# Patient Record
Sex: Male | Born: 1955
Health system: Southern US, Community
[De-identification: ages and names within clinical notes are randomized; demographics above are authoritative.]

## PROBLEM LIST (undated history)

## (undated) DIAGNOSIS — F419 Anxiety disorder, unspecified: Secondary | ICD-10-CM

## (undated) DIAGNOSIS — R7303 Prediabetes: Secondary | ICD-10-CM

## (undated) DIAGNOSIS — F32A Depression, unspecified: Secondary | ICD-10-CM

## (undated) DIAGNOSIS — M1990 Unspecified osteoarthritis, unspecified site: Secondary | ICD-10-CM

## (undated) DIAGNOSIS — G4730 Sleep apnea, unspecified: Secondary | ICD-10-CM

## (undated) DIAGNOSIS — K219 Gastro-esophageal reflux disease without esophagitis: Secondary | ICD-10-CM

## (undated) HISTORY — DX: Unspecified osteoarthritis, unspecified site: M19.90

## (undated) HISTORY — PX: SPINAL FUSION: SHX223

## (undated) HISTORY — DX: Depression, unspecified: F32.A

## (undated) HISTORY — DX: Gastro-esophageal reflux disease without esophagitis: K21.9

## (undated) HISTORY — PX: KNEE SURGERY: SHX244

## (undated) HISTORY — PX: OTHER SURGICAL HISTORY: SHX169

## (undated) HISTORY — PX: AUTOGRAFT BONE SPINE: SUR117

## (undated) HISTORY — PX: TONSILLECTOMY: SUR1361

## (undated) HISTORY — PX: WISDOM TOOTH EXTRACTION: SHX21

---

## 2003-05-23 ENCOUNTER — Emergency Department (HOSPITAL_COMMUNITY): Admission: EM | Admit: 2003-05-23 | Discharge: 2003-05-23 | Payer: Self-pay | Admitting: Emergency Medicine

## 2003-05-23 ENCOUNTER — Encounter: Payer: Self-pay | Admitting: Emergency Medicine

## 2016-11-23 ENCOUNTER — Encounter (HOSPITAL_COMMUNITY): Payer: Self-pay | Admitting: *Deleted

## 2016-11-23 ENCOUNTER — Ambulatory Visit (HOSPITAL_COMMUNITY)
Admission: EM | Admit: 2016-11-23 | Discharge: 2016-11-23 | Disposition: A | Discharge status: Home / Self Care | Payer: BLUE CROSS/BLUE SHIELD | Attending: Family Medicine | Admitting: Family Medicine

## 2016-11-23 DIAGNOSIS — J111 Influenza due to unidentified influenza virus with other respiratory manifestations: Secondary | ICD-10-CM

## 2016-11-23 DIAGNOSIS — R69 Illness, unspecified: Secondary | ICD-10-CM | POA: Diagnosis not present

## 2016-11-23 MED ORDER — IPRATROPIUM BROMIDE 0.06 % NA SOLN: 2.0000 | Freq: Four times a day (QID) | NASAL | 1 refills | Status: DC

## 2016-11-23 MED ORDER — OSELTAMIVIR PHOSPHATE 75 MG PO CAPS: 75.0000 mg | ORAL_CAPSULE | Freq: Two times a day (BID) | ORAL | 0 refills | Status: DC

## 2016-11-23 MED ORDER — HYDROCOD POLST-CPM POLST ER 10-8 MG/5ML PO SUER: 5.0000 mL | Freq: Two times a day (BID) | ORAL | 0 refills | Status: DC | PRN

## 2016-11-23 NOTE — Discharge Instructions (Signed)
Drink plenty of fluids as discussed, use medicine as prescribed, and mucinex or delsym for cough. Return or see your doctor if further problems °

## 2016-11-23 NOTE — ED Provider Notes (Signed)
[facility]    CSN: [id number] Arrival date & time: 11/23/16  [place]     History   Chief Complaint Chief Complaint  Patient presents with  . Cough    HPI Brian Villanueva is a 61 y.o. male.   The history is provided by the patient and the spouse.  Cough  Cough characteristics:  Non-productive Severity:  Moderate Onset quality:  Sudden Duration:  1 day Progression:  Unchanged Chronicity:  New Smoker: no   Context: weather changes   Context: not sick contacts   Relieved by:  None tried Worsened by:  Nothing Ineffective treatments:  None tried Associated symptoms: chills, fever, myalgias, rhinorrhea and sore throat   Risk factors comment:  No flu vaccination this season.   History reviewed. No pertinent past medical history.  There are no active problems to display for this patient.   History reviewed. No pertinent surgical history.     Home Medications    Prior to Admission medications   Medication Sig Start Date End Date Taking? Authorizing Provider  chlorpheniramine-HYDROcodone (TUSSIONEX PENNKINETIC ER) 10-8 MG/5ML SUER Take 5 mLs by mouth every 12 (twelve) hours as needed for cough. 11/23/16   Billy Fischer, MD  ipratropium (ATROVENT) 0.06 % nasal spray Place 2 sprays into both nostrils 4 (four) times daily. 11/23/16   Billy Fischer, MD  oseltamivir (TAMIFLU) 75 MG capsule Take 1 capsule (75 mg total) by mouth every 12 (twelve) hours. Take all of medication. 11/23/16   Billy Fischer, MD    Family History History reviewed. No pertinent family history.  Social History Social History  Substance Use Topics  . Smoking status: Current Some Day Smoker  . Smokeless tobacco: Not on file  . Alcohol use Yes     Comment: SOCIAL     Allergies   Patient has no known allergies.   Review of Systems Review of Systems  Constitutional: Positive for chills and fever.  HENT: Positive for congestion, postnasal drip, rhinorrhea and sore throat.     Respiratory: Positive for cough.   Gastrointestinal: Negative.   Genitourinary: Negative.   Musculoskeletal: Positive for myalgias.  All other systems reviewed and are negative.    Physical Exam Triage Vital Signs ED Triage Vitals  Enc Vitals Group     BP 11/23/16 1937 134/55     Pulse Rate 11/23/16 1937 110     Resp 11/23/16 1937 18     Temp 11/23/16 1937 100.4 F (38 C)     Temp Source 11/23/16 1937 Oral     SpO2 11/23/16 1937 96 %     Weight --      Height --      Head Circumference --      Peak Flow --      Pain Score 11/23/16 1934 6     Pain Loc --      Pain Edu? --      Excl. in [place]? --    No data found.   Updated Vital Signs BP 134/55 (BP Location: Right Arm)   Pulse 110   Temp 100.4 F (38 C) (Oral)   Resp 18   SpO2 96%   Visual Acuity Right Eye Distance:   Left Eye Distance:   Bilateral Distance:    Right Eye Near:   Left Eye Near:    Bilateral Near:     Physical Exam  Constitutional: He is oriented to person, place, and time. He appears well-developed and well-nourished. No  distress.  HENT:  Right Ear: External ear normal.  Left Ear: External ear normal.  Mouth/Throat: Oropharynx is clear and moist.  Eyes: Conjunctivae are normal. Pupils are equal, round, and reactive to light.  Neck: Normal range of motion.  Cardiovascular: Normal rate, regular rhythm, normal heart sounds and intact distal pulses.   Pulmonary/Chest: Effort normal and breath sounds normal. He has no wheezes. He has no rales.  Lymphadenopathy:    He has no cervical adenopathy.  Neurological: He is alert and oriented to person, place, and time.  Nursing note and vitals reviewed.    UC Treatments / Results  Labs (all labs ordered are listed, but only abnormal results are displayed) Labs Reviewed - No data to display  EKG  EKG Interpretation None       Radiology No results found.  Procedures Procedures (including critical care time)  Medications Ordered in  UC Medications - No data to display   Initial Impression / Assessment and Plan / UC Course  I have reviewed the triage vital signs and the nursing notes.  Pertinent labs & imaging results that were available during my care of the patient were reviewed by me and considered in my medical decision making (see chart for details).       Final Clinical Impressions(s) / UC Diagnoses   Final diagnoses:  Influenza-like illness    New Prescriptions Discharge Medication List as of 11/23/2016  8:11 PM    START taking these medications   Details  chlorpheniramine-HYDROcodone (TUSSIONEX PENNKINETIC ER) 10-8 MG/5ML SUER Take 5 mLs by mouth every 12 (twelve) hours as needed for cough., Starting Fri 11/23/2016, Print    ipratropium (ATROVENT) 0.06 % nasal spray Place 2 sprays into both nostrils 4 (four) times daily., Starting Fri 11/23/2016, Print    oseltamivir (TAMIFLU) 75 MG capsule Take 1 capsule (75 mg total) by mouth every 12 (twelve) hours. Take all of medication., Starting Fri 11/23/2016, Print         Billy Fischer, MD 12/11/16 2144

## 2016-11-23 NOTE — ED Triage Notes (Signed)
Pt   Reports         Symptoms  Of  Body   Aches          Fever      Weakness   Started  Last  Pm

## 2017-04-15 ENCOUNTER — Encounter: Payer: Self-pay | Admitting: Podiatry

## 2017-04-15 ENCOUNTER — Ambulatory Visit (INDEPENDENT_AMBULATORY_CARE_PROVIDER_SITE_OTHER): Payer: BLUE CROSS/BLUE SHIELD | Admitting: Podiatry

## 2017-04-15 VITALS — BP 145/94 | HR 68

## 2017-04-15 DIAGNOSIS — L84 Corns and callosities: Secondary | ICD-10-CM | POA: Diagnosis not present

## 2017-04-15 DIAGNOSIS — M2041 Other hammer toe(s) (acquired), right foot: Secondary | ICD-10-CM

## 2017-04-15 DIAGNOSIS — M779 Enthesopathy, unspecified: Secondary | ICD-10-CM | POA: Diagnosis not present

## 2017-04-15 DIAGNOSIS — B351 Tinea unguium: Secondary | ICD-10-CM

## 2017-04-15 MED ORDER — TRIAMCINOLONE ACETONIDE 10 MG/ML IJ SUSP
10.0000 mg | Freq: Once | INTRAMUSCULAR | Status: AC
  Administered 2017-04-15: 10 mg

## 2017-04-15 NOTE — Progress Notes (Signed)
   Subjective:    Patient ID: Brian Villanueva, male    DOB: 21-Aug-1956, 61 y.o.   MRN: [id number]  HPI  Chief Complaint  Patient presents with  . Callouses    Rt foot 5th toe is painful for about 3 years       Review of Systems  All other systems reviewed and are negative.      Objective:   Physical Exam        Assessment & Plan:

## 2017-04-15 NOTE — Progress Notes (Signed)
Subjective:    Patient ID: Brian Villanueva, male   DOB: 61 y.o.   MRN: [id number]   HPI patient presents stating that his fifth digit on his right foot is been very tender and it makes it hard to wear shoes that he's tried to trim it and pad it himself    Review of Systems  All other systems reviewed and are negative.       Objective:  Physical Exam  Cardiovascular: Intact distal pulses.   Musculoskeletal: Normal range of motion.  Neurological: He is alert.  Skin: Skin is warm.  Nursing note and vitals reviewed.  neurovascular status intact muscle strength was adequate range of motion within normal limits with patient noted to have a very painful fifth digit right on the proximal phalanx proximal portion lateral side with fluid buildup underneath it that's painful when palpated. Patient's found have good digital perfusion well oriented 3     Assessment:   Inflammatory capsulitis interphalangeal joint digit 5 right with lesion formation which is most likely keratotic secondary to bone pressure but also be related to possible verruca plantaris      Plan:  H&P conditions reviewed and proximal nerve block administered. I went ahead today and I did a small injection of 2 mg Dexon some Kenalog and then went ahead debris did lesion small small amount of pinpoint bleeding and applied immune agent to create a response to virus. Applied sterile dressing reappoint to recheck

## 2017-05-06 ENCOUNTER — Ambulatory Visit (INDEPENDENT_AMBULATORY_CARE_PROVIDER_SITE_OTHER): Payer: BLUE CROSS/BLUE SHIELD | Admitting: Podiatry

## 2017-05-06 DIAGNOSIS — L84 Corns and callosities: Secondary | ICD-10-CM

## 2017-05-06 DIAGNOSIS — B079 Viral wart, unspecified: Secondary | ICD-10-CM | POA: Diagnosis not present

## 2017-05-06 DIAGNOSIS — M2041 Other hammer toe(s) (acquired), right foot: Secondary | ICD-10-CM

## 2017-05-06 NOTE — Progress Notes (Signed)
Subjective:    Patient ID: Brian Villanueva, male   DOB: 61 y.o.   MRN: [id number]   HPI patient presents stating that seems to be improved but it still sore and it had been there for a number of years    ROS      Objective:  Physical Exam neurovascular status intact negative Homans sign noted with patient's right fifth digit showing keratotic lesion that upon debridement shows pinpoint bleeding and pain to lateral pressure but stable     Assessment:    Verruca plantaris fifth digit right     Plan:    Debridement accomplished and applied medication to create an immune response and sterile dressing. Reappoint to recheck

## 2019-09-04 ENCOUNTER — Other Ambulatory Visit: Payer: Self-pay

## 2019-09-04 DIAGNOSIS — Z20822 Contact with and (suspected) exposure to covid-19: Secondary | ICD-10-CM

## 2019-09-05 LAB — NOVEL CORONAVIRUS, NAA: SARS-CoV-2, NAA: NOT DETECTED

## 2020-02-11 ENCOUNTER — Telehealth: Payer: Self-pay | Admitting: General Practice

## 2020-02-11 NOTE — Telephone Encounter (Signed)
Ok to schedule in open 30 min slot. Thanks.

## 2020-02-11 NOTE — Telephone Encounter (Signed)
Patient's wife called today Patient would like to start seeing you for primary care. Patient's father and Brother in law are both patients of yours and they recommended you   Okay to schedule?

## 2020-02-18 ENCOUNTER — Other Ambulatory Visit: Payer: Self-pay

## 2020-02-22 ENCOUNTER — Other Ambulatory Visit: Payer: Self-pay

## 2020-02-22 ENCOUNTER — Encounter: Payer: Self-pay | Admitting: Family Medicine

## 2020-02-22 ENCOUNTER — Ambulatory Visit (INDEPENDENT_AMBULATORY_CARE_PROVIDER_SITE_OTHER): Payer: Medicare Other | Admitting: Family Medicine

## 2020-02-22 VITALS — BP 120/80 | HR 70 | Temp 97.9°F | Ht 73.75 in | Wt 291.1 lb

## 2020-02-22 DIAGNOSIS — E669 Obesity, unspecified: Secondary | ICD-10-CM | POA: Insufficient documentation

## 2020-02-22 DIAGNOSIS — R12 Heartburn: Secondary | ICD-10-CM | POA: Diagnosis not present

## 2020-02-22 DIAGNOSIS — Z1211 Encounter for screening for malignant neoplasm of colon: Secondary | ICD-10-CM | POA: Diagnosis not present

## 2020-02-22 DIAGNOSIS — K219 Gastro-esophageal reflux disease without esophagitis: Secondary | ICD-10-CM | POA: Insufficient documentation

## 2020-02-22 DIAGNOSIS — E66812 Obesity, class 2: Secondary | ICD-10-CM

## 2020-02-22 HISTORY — DX: Obesity, unspecified: E66.9

## 2020-02-22 HISTORY — DX: Obesity, class 2: E66.812

## 2020-02-22 MED ORDER — ASCORBIC ACID 500 MG PO TABS: 1000.0000 mg | ORAL_TABLET | Freq: Every day | ORAL | Status: AC

## 2020-02-22 MED ORDER — OMEPRAZOLE 20 MG PO CPDR: 20.0000 mg | DELAYED_RELEASE_CAPSULE | Freq: Every day | ORAL | Status: DC | PRN

## 2020-02-22 NOTE — Assessment & Plan Note (Signed)
Discussed omeprazole use as well as pros/cons of medication. Given no significant reflux symptoms endorsed, suggested trial taper down on omeprazole use.

## 2020-02-22 NOTE — Assessment & Plan Note (Signed)
Encouraged healthy diet and lifestyle to affect sustainable weight loss. Reviewed beneficial effect of weight loss on joints.

## 2020-02-22 NOTE — Patient Instructions (Addendum)
Try taper off omeprazole - find least amount of medicine to do the job.  Return at your convenience for wellness visit/annual physical.  We will refer you to [facility] for colonoscopy  Good to meet you today. Call us with questions.

## 2020-02-22 NOTE — Progress Notes (Signed)
This visit was conducted in person.  BP 120/80 (BP Location: Left Arm, Patient Position: Sitting, Cuff Size: Large)   Pulse 70   Temp 97.9 F (36.6 C) (Temporal)   Ht 6' 1.75" (1.873 m)   Wt 291 lb 1 oz (132 kg)   SpO2 95%   BMI 37.62 kg/m    CC: new pt to establish care Subjective:    Patient ID: Brian Villanueva, male    DOB: 12/17/1955, 64 y.o.   MRN: [id number]  HPI: Brian Villanueva is a 64 y.o. male presenting on 02/22/2020 for New Patient (Initial Visit)   I see patient's father Mally) and BIL Leta Baptist). Here to establish care today. No prior PCP. Received medicare last year (>1 yr ago).   Would like to return for AMW/CPE.  Colon cancer screening - discussed options, requests colonoscopy Sees dentist and eye doctor regularly.   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 Interim medical history since our last visit reviewed. Allergies and medications reviewed and updated. Outpatient Medications Prior to Visit  Medication Sig Dispense Refill  . Cholecalciferol (VITAMIN D3) 50 MCG (2000 UT) TABS Take 1 tablet by mouth daily.    Marland Kitchen GLUCOSAMINE-CHONDROITIN PO Take 2 tablets by mouth daily. 1200 mg    . Multiple Vitamins-Minerals (CENTRUM SILVER 50+MEN PO) Take by mouth daily.    . Zinc 22.5 MG TABS Take 1 tablet by mouth daily.    . Ascorbic Acid (VITAMIN C) 1000 MG tablet Take 1,000 mg by mouth daily. Takes 2 tablets    . OMEPRAZOLE PO Take 20 mg by mouth daily. OTC    . chlorpheniramine-HYDROcodone (TUSSIONEX PENNKINETIC ER) 10-8 MG/5ML SUER Take 5 mLs by mouth every 12 (twelve) hours as needed for cough. (Patient not taking: Reported on 04/15/2017) 140 mL 0  . ipratropium (ATROVENT) 0.06 % nasal  spray Place 2 sprays into both nostrils 4 (four) times daily. (Patient not taking: Reported on 04/15/2017) 15 mL 1  . oseltamivir (TAMIFLU) 75 MG capsule Take 1 capsule (75 mg total) by mouth every 12 (twelve) hours. Take all of medication. (Patient not taking: Reported on 04/15/2017) 10 capsule 0   No facility-administered medications prior to visit.     Per HPI unless specifically indicated in ROS section below Review of Systems Objective:    BP 120/80 (BP Location: Left Arm, Patient Position: Sitting, Cuff Size: Large)   Pulse 70   Temp 97.9 F (36.6 C) (Temporal)   Ht 6' 1.75" (1.873 m)   Wt 291 lb 1 oz (132 kg)   SpO2 95%   BMI 37.62 kg/m   Wt Readings from Last 3 Encounters:  02/22/20 291 lb 1 oz (132 kg)    Physical Exam Vitals and nursing note reviewed.  Constitutional:      Appearance: Normal appearance.  Eyes:     Extraocular Movements: Extraocular movements intact.     Conjunctiva/sclera: Conjunctivae normal.     Comments:  L pupil slightly larger than right, chronic Pupils equally reactive to light  Cardiovascular:     Rate and Rhythm: Normal rate and regular rhythm.     Pulses: Normal pulses.     Heart sounds: Normal heart sounds. No murmur.  Pulmonary:  Effort: Pulmonary effort is normal. No respiratory distress.     Breath sounds: Normal breath sounds. No wheezing, rhonchi or rales.  Musculoskeletal:     Right lower leg: No edema.     Left lower leg: No edema.  Skin:    Findings: No rash.  Neurological:     Mental Status: He is alert.  Psychiatric:        Mood and Affect: Mood normal.        Behavior: Behavior normal.       Results for orders placed or performed in visit on 09/04/19  Novel Coronavirus, NAA ([facility])   Specimen: Nasopharyngeal(NP) swabs in vial transport medium   NASOPHARYNGE  TESTING  Result Value Ref Range   SARS-CoV-2, NAA Not Detected Not Detected   Assessment & Plan:  This visit occurred during the SARS-CoV-2 public health  emergency.  Safety protocols were in place, including screening questions prior to the visit, additional usage of staff PPE, and extensive cleaning of exam room while observing appropriate contact time as indicated for disinfecting solutions.   Problem List Items Addressed This Visit    Obesity, Class II, BMI 35-39.9, no comorbidity    Encouraged healthy diet and lifestyle to affect sustainable weight loss. Reviewed beneficial effect of weight loss on joints.      Heartburn    Discussed omeprazole use as well as pros/cons of medication. Given no significant reflux symptoms endorsed, suggested trial taper down on omeprazole use.        Other Visit Diagnoses    Special screening for malignant neoplasms, colon    -  Primary   Relevant Orders   Ambulatory referral to Gastroenterology       Meds ordered this encounter  Medications  . vitamin C (VITAMIN C) 500 MG tablet    Sig: Take 2 tablets (1,000 mg total) by mouth daily.  Marland Kitchen omeprazole (PRILOSEC) 20 MG capsule    Sig: Take 1 capsule (20 mg total) by mouth daily as needed (heartburn).   Orders Placed This Encounter  Procedures  . Ambulatory referral to Gastroenterology    Referral Priority:   Routine    Referral Type:   Consultation    Referral Reason:   Specialty Services Required    Number of Visits Requested:   1   Patient Instructions  Try taper off omeprazole - find least amount of medicine to do the job.  Return at your convenience for wellness visit/annual physical.  We will refer you to [facility] for colonoscopy  Good to meet you today. Call us with questions.    Follow up plan: No follow-ups on file.  Ria Bush, MD

## 2020-02-23 ENCOUNTER — Encounter: Payer: Self-pay | Admitting: Gastroenterology

## 2020-03-03 ENCOUNTER — Ambulatory Visit (AMBULATORY_SURGERY_CENTER): Payer: Self-pay | Admitting: *Deleted

## 2020-03-03 ENCOUNTER — Other Ambulatory Visit: Payer: Self-pay

## 2020-03-03 ENCOUNTER — Ambulatory Visit (INDEPENDENT_AMBULATORY_CARE_PROVIDER_SITE_OTHER): Payer: Medicare Other

## 2020-03-03 VITALS — Temp 96.6°F | Ht 73.75 in | Wt 291.0 lb

## 2020-03-03 VITALS — BP 120/80 | HR 70 | Ht 73.75 in | Wt 291.0 lb

## 2020-03-03 DIAGNOSIS — Z0000 Encounter for general adult medical examination without abnormal findings: Secondary | ICD-10-CM | POA: Diagnosis not present

## 2020-03-03 DIAGNOSIS — Z1211 Encounter for screening for malignant neoplasm of colon: Secondary | ICD-10-CM

## 2020-03-03 MED ORDER — SUTAB 1479-225-188 MG PO TABS: 24.0000 | ORAL_TABLET | ORAL | 0 refills | Status: DC

## 2020-03-03 NOTE — Progress Notes (Signed)
PCP notes:  Health Maintenance: Prevnar 18- due Tdap- decline Colonoscopy- scheduled for 03/17/2020 @ 9:30 am    Abnormal Screenings: none   Patient concerns: none   Nurse concerns: none   Next PCP appt.: 03/10/2020 @ 10:30 am

## 2020-03-03 NOTE — Progress Notes (Signed)
Subjective:   Brian Villanueva is a 64 y.o. male who presents for an Initial Medicare Annual Wellness Visit.  Review of Systems: N/A    This visit is being conducted through telemedicine via telephone at the nurse health advisor's home address due to the COVID-19 pandemic. This patient has given me verbal consent via doximity to conduct this visit, patient states they are participating from their home address. Patient and myself are on the telephone call. There is no referral for this visit. Some vital signs may be absent or patient reported.    Patient identification: identified by name, DOB, and current address   Cardiac Risk Factors include: advanced age (>33men, >89 women);male gender    Objective:    Today's Vitals   03/03/20 0941  BP: 120/80  Pulse: 70  Weight: 291 lb (132 kg)  Height: 6' 1.75" (1.873 m)   Body mass index is 37.62 kg/m.  Advanced Directives 03/03/2020  Does Patient Have a Medical Advance Directive? No  Would patient like information on creating a medical advance directive? No - Patient declined    Current Medications (verified) Outpatient Encounter Medications as of 03/03/2020  Medication Sig  . Cholecalciferol (VITAMIN D3) 50 MCG (2000 UT) TABS Take 1 tablet by mouth daily.  Marland Kitchen GLUCOSAMINE-CHONDROITIN PO Take 2 tablets by mouth daily. 1200 mg  . Multiple Vitamins-Minerals (CENTRUM SILVER 50+MEN PO) Take by mouth daily.  Marland Kitchen omeprazole (PRILOSEC) 20 MG capsule Take 1 capsule (20 mg total) by mouth daily as needed (heartburn).  . vitamin C (VITAMIN C) 500 MG tablet Take 2 tablets (1,000 mg total) by mouth daily.  . Zinc 22.5 MG TABS Take 1 tablet by mouth daily.   No facility-administered encounter medications on file as of 03/03/2020.    Allergies (verified) Patient has no known allergies.   History: History reviewed. No pertinent past medical history. Past Surgical History:  Procedure Laterality Date  . KNEE SURGERY Left    x2 growing up -  ligament tears   Family History  Problem Relation Age of Onset  . Cancer Mother        throat  . High Cholesterol Father   . High blood pressure Father   . Atrial fibrillation Father   . Arthritis Maternal Grandfather   . Cancer Paternal Grandmother    Social History   Socioeconomic History  . Marital status: Married    Spouse name: Not on file  . Number of children: Not on file  . Years of education: Not on file  . Highest education level: Not on file  Occupational History  . Not on file  Tobacco Use  . Smoking status: Light Tobacco Smoker    Types: Cigars  . Smokeless tobacco: Never Used  Substance and Sexual Activity  . Alcohol use: Yes    Comment: social - a few a month  . Drug use: Never  . Sexual activity: Yes    Partners: Female  Other Topics Concern  . 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 Difficulty of Paying Living Expenses: Not hard at all  Food Insecurity: No Food Insecurity  . Worried About Charity fundraiser in the Last  Year: Never true  . Ran Out of Food in the Last Year: Never true  Transportation Needs: No Transportation Needs  . Lack of Transportation (Medical): No  . Lack of Transportation (Non-Medical): No  Physical Activity: Inactive  . Days of Exercise per Week: 0 days  . Minutes of Exercise per Session: 0 min  Stress: No Stress Concern Present  . Feeling of Stress : Not at all  Social Connections:   . Frequency of Communication with Friends and Family:   . Frequency of Social Gatherings with Friends and Family:   . Attends Religious Services:   . Active Member of Clubs or Organizations:   . Attends Archivist Meetings:   Marland Kitchen Marital Status:    Tobacco  Counseling Ready to quit: Not Answered Counseling given: Not Answered   Clinical Intake:  Pre-visit preparation completed: Yes  Pain : No/denies pain     Nutritional Risks: None Diabetes: No  How often do you need to have someone help you when you read instructions, pamphlets, or other written materials from your doctor or pharmacy?: 1 - Never What is the last grade level you completed in school?: some college  Interpreter Needed?: No  Information entered by :: CJohnson, LPN  Activities of Daily Living In your present state of health, do you have any difficulty performing the following activities: 03/03/2020  Hearing? N  Vision? N  Difficulty concentrating or making decisions? N  Walking or climbing stairs? N  Dressing or bathing? N  Doing errands, shopping? N  Preparing Food and eating ? N  Using the Toilet? N  In the past six months, have you accidently leaked urine? N  Do you have problems with loss of bowel control? N  Managing your Medications? N  Managing your Finances? N  Housekeeping or managing your Housekeeping? N  Some recent data might be hidden     Immunizations and Health Maintenance Immunization History  Administered Date(s) Administered  . Influenza-Unspecified 07/31/2019  . PFIZER SARS-COV-2 Vaccination 12/25/2019, 01/19/2020   Health Maintenance Due  Topic Date Due  . Hepatitis C Screening  Never done  . DTaP/Tdap/Td (1 - Tdap) Never done  . COLONOSCOPY  Never done  . PNA vac Low Risk Adult (1 of 2 - PCV13) Never done    Patient Care Team: Ria Bush, MD as PCP - General (Family Medicine)  Indicate any recent Medical Services you may have received from other than [facility] providers in the past year (date may be approximate).    Assessment:   This is a routine wellness examination for Brian Villanueva.  Hearing/Vision screen  Hearing Screening   125Hz  250Hz  500Hz  1000Hz  2000Hz  3000Hz  4000Hz  6000Hz  8000Hz   Right ear:           Left ear:            Vision Screening Comments: Patient gets annual eye exams  Dietary issues and exercise activities discussed: Current Exercise Habits: The patient has a physically strenuous job, but has no regular exercise apart from work., Exercise limited by: None identified  Goals    . Patient Stated     03/03/2020, I will maintain and continue medications as prescribed.       Depression Screen PHQ 2/9 Scores 03/03/2020 02/22/2020  PHQ - 2 Score 0 0  PHQ- 9 Score 0 2    Fall Risk Fall Risk  03/03/2020  Falls in the past year? 0  Number falls in past yr: 0  Injury with Fall? 0  Risk for fall due to : No Fall Risks  Follow up Falls evaluation completed;Falls prevention discussed    Is the patient's home free of loose throw rugs in walkways, pet beds, electrical cords, etc?   yes      Grab bars in the bathroom? no      Handrails on the stairs?   yes      Adequate lighting?   yes  Timed Get Up and Go performed: N/A  Cognitive Function: MMSE - Mini Mental State Exam 03/03/2020  Orientation to time 5  Orientation to Place 5  Registration 3  Attention/ Calculation 5  Recall 3  Language- repeat 1       Mini Cog  Mini-Cog screen was completed. Maximum score is 22. A value of 0 denotes this part of the MMSE was not completed or the patient failed this part of the Mini-Cog screening.  Screening Tests Health Maintenance  Topic Date Due  . Hepatitis C Screening  Never done  . DTaP/Tdap/Td (1 - Tdap) Never done  . COLONOSCOPY  Never done  . PNA vac Low Risk Adult (1 of 2 - PCV13) Never done  . DTAP VACCINES (1) 02/11/2024 (Originally 02/24/1956)  . TETANUS/TDAP  02/11/2024 (Originally 12/27/1972)  . INFLUENZA VACCINE  06/12/2020  . COVID-19 Vaccine  Completed    Qualifies for Shingles Vaccine: Yes  Cancer Screenings: Lung: Low Dose CT Chest recommended if Age 61-80 years, 30 pack-year currently smoking OR have quit w/in 15 years. Patient does not qualify. Colorectal: scheduled for  03/17/2020 at 9:30 am per patient   Additional Screenings:  Hepatitis C Screening: due      Plan:   Patient will maintain and continue medications as prescribed.   I have personally reviewed and noted the following in the patient's chart:   . Medical and social history . Use of alcohol, tobacco or illicit drugs  . Current medications and supplements . Functional ability and status . Nutritional status . Physical activity . Advanced directives . List of other physicians . Hospitalizations, surgeries, and ER visits in previous 12 months . Vitals . Screenings to include cognitive, depression, and falls . Referrals and appointments  In addition, I have reviewed and discussed with patient certain preventive protocols, quality metrics, and best practice recommendations. A written personalized care plan for preventive services as well as general preventive health recommendations were provided to patient.     Andrez Grime, LPN   [phone]

## 2020-03-03 NOTE — Patient Instructions (Signed)
[name] , Thank you for taking time to come for your Medicare Wellness Visit. I appreciate your ongoing commitment to your health goals. Please review the following plan we discussed and let me know if I can assist you in the future.   Screening recommendations/referrals: Colonoscopy: scheduled 03/17/2020 @ 9:30 am Recommended yearly ophthalmology/optometry visit for glaucoma screening and checkup Recommended yearly dental visit for hygiene and checkup  Vaccinations: Influenza vaccine: Up to date, completed 07/31/2019 Pneumococcal vaccine: due Tdap vaccine: decline Shingles vaccine: discussed    Advanced directives: Advance directive discussed with you today. Even though you declined this today please call our office should you change your mind and we can give you the proper paperwork for you to fill out.  Conditions/risks identified: none  Next appointment: 03/10/2020 @ 10:30 am   Preventive Care 65 Years and Older, Male Preventive care refers to lifestyle choices and visits with your health care provider that can promote health and wellness. What does preventive care include?  A yearly physical exam. This is also called an annual well check.  Dental exams once or twice a year.  Routine eye exams. Ask your health care provider how often you should have your eyes checked.  Personal lifestyle choices, including:  Daily care of your teeth and gums.  Regular physical activity.  Eating a healthy diet.  Avoiding tobacco and drug use.  Limiting alcohol use.  Practicing safe sex.  Taking low doses of aspirin every day.  Taking vitamin and mineral supplements as recommended by your health care provider. What happens during an annual well check? The services and screenings done by your health care provider during your annual well check will depend on your age, overall health, lifestyle risk factors, and family history of disease. Counseling  Your health care provider may ask  you questions about your:  Alcohol use.  Tobacco use.  Drug use.  Emotional well-being.  Home and relationship well-being.  Sexual activity.  Eating habits.  History of falls.  Memory and ability to understand (cognition).  Work and work Statistician. Screening  You may have the following tests or measurements:  Height, weight, and BMI.  Blood pressure.  Lipid and cholesterol levels. These may be checked every 5 years, or more frequently if you are over 53 years old.  Skin check.  Lung cancer screening. You may have this screening every year starting at age 37 if you have a 30-pack-year history of smoking and currently smoke or have quit within the past 15 years.  Fecal occult blood test (FOBT) of the stool. You may have this test every year starting at age 16.  Flexible sigmoidoscopy or colonoscopy. You may have a sigmoidoscopy every 5 years or a colonoscopy every 10 years starting at age 47.  Prostate cancer screening. Recommendations will vary depending on your family history and other risks.  Hepatitis C blood test.  Hepatitis B blood test.  Sexually transmitted disease (STD) testing.  Diabetes screening. This is done by checking your blood sugar (glucose) after you have not eaten for a while (fasting). You may have this done every 1-3 years.  Abdominal aortic aneurysm (AAA) screening. You may need this if you are a current or former smoker.  Osteoporosis. You may be screened starting at age 48 if you are at high risk. Talk with your health care provider about your test results, treatment options, and if necessary, the need for more tests. Vaccines  Your health care provider may recommend certain vaccines, such  as:  Influenza vaccine. This is recommended every year.  Tetanus, diphtheria, and acellular pertussis (Tdap, Td) vaccine. You may need a Td booster every 10 years.  Zoster vaccine. You may need this after age 27.  Pneumococcal 13-valent conjugate  (PCV13) vaccine. One dose is recommended after age 19.  Pneumococcal polysaccharide (PPSV23) vaccine. One dose is recommended after age 36. Talk to your health care provider about which screenings and vaccines you need and how often you need them. This information is not intended to replace advice given to you by your health care provider. Make sure you discuss any questions you have with your health care provider. Document Released: 11/25/2015 Document Revised: 07/18/2016 Document Reviewed: 08/30/2015 Elsevier Interactive Patient Education  2017 [employer] Prevention in the Home Falls can cause injuries. They can happen to people of all ages. There are many things you can do to make your home safe and to help prevent falls. What can I do on the outside of my home?  Regularly fix the edges of walkways and driveways and fix any cracks.  Remove anything that might make you trip as you walk through a door, such as a raised step or threshold.  Trim any bushes or trees on the path to your home.  Use bright outdoor lighting.  Clear any walking paths of anything that might make someone trip, such as rocks or tools.  Regularly check to see if handrails are loose or broken. Make sure that both sides of any steps have handrails.  Any raised decks and porches should have guardrails on the edges.  Have any leaves, snow, or ice cleared regularly.  Use sand or salt on walking paths during winter.  Clean up any spills in your garage right away. This includes oil or grease spills. What can I do in the bathroom?  Use night lights.  Install grab bars by the toilet and in the tub and shower. Do not use towel bars as grab bars.  Use non-skid mats or decals in the tub or shower.  If you need to sit down in the shower, use a plastic, non-slip stool.  Keep the floor dry. Clean up any water that spills on the floor as soon as it happens.  Remove soap buildup in the tub or shower  regularly.  Attach bath mats securely with double-sided non-slip rug tape.  Do not have throw rugs and other things on the floor that can make you trip. What can I do in the bedroom?  Use night lights.  Make sure that you have a light by your bed that is easy to reach.  Do not use any sheets or blankets that are too big for your bed. They should not hang down onto the floor.  Have a firm chair that has side arms. You can use this for support while you get dressed.  Do not have throw rugs and other things on the floor that can make you trip. What can I do in the kitchen?  Clean up any spills right away.  Avoid walking on wet floors.  Keep items that you use a lot in easy-to-reach places.  If you need to reach something above you, use a strong step stool that has a grab bar.  Keep electrical cords out of the way.  Do not use floor polish or wax that makes floors slippery. If you must use wax, use non-skid floor wax.  Do not have throw rugs and other things on the  floor that can make you trip. What can I do with my stairs?  Do not leave any items on the stairs.  Make sure that there are handrails on both sides of the stairs and use them. Fix handrails that are broken or loose. Make sure that handrails are as long as the stairways.  Check any carpeting to make sure that it is firmly attached to the stairs. Fix any carpet that is loose or worn.  Avoid having throw rugs at the top or bottom of the stairs. If you do have throw rugs, attach them to the floor with carpet tape.  Make sure that you have a light switch at the top of the stairs and the bottom of the stairs. If you do not have them, ask someone to add them for you. What else can I do to help prevent falls?  Wear shoes that:  Do not have high heels.  Have rubber bottoms.  Are comfortable and fit you well.  Are closed at the toe. Do not wear sandals.  If you use a stepladder:  Make sure that it is fully  opened. Do not climb a closed stepladder.  Make sure that both sides of the stepladder are locked into place.  Ask someone to hold it for you, if possible.  Clearly mark and make sure that you can see:  Any grab bars or handrails.  First and last steps.  Where the edge of each step is.  Use tools that help you move around (mobility aids) if they are needed. These include:  Canes.  Walkers.  Scooters.  Crutches.  Turn on the lights when you go into a dark area. Replace any light bulbs as soon as they burn out.  Set up your furniture so you have a clear path. Avoid moving your furniture around.  If any of your floors are uneven, fix them.  If there are any pets around you, be aware of where they are.  Review your medicines with your doctor. Some medicines can make you feel dizzy. This can increase your chance of falling. Ask your doctor what other things that you can do to help prevent falls. This information is not intended to replace advice given to you by your health care provider. Make sure you discuss any questions you have with your health care provider. Document Released: 08/25/2009 Document Revised: 04/05/2016 Document Reviewed: 12/03/2014 Elsevier Interactive Patient Education  2017 [employer].

## 2020-03-03 NOTE — Progress Notes (Signed)
No egg or soy allergy known to patient  No issues with past sedation with any surgeries  or procedures, no intubation problems  No diet pills per patient No home 02 use per patient  No blood thinners per patient  Pt denies issues with constipation  No A fib or A flutter  EMMI video sent to pt's e mail   01-19-2020 comp covid vaccines   Due to the COVID-19 pandemic we are asking patients to follow these guidelines. Please only bring one care partner. Please be aware that your care partner may wait in the car in the parking lot or if they feel like they will be too hot to wait in the car, they may wait in the lobby on the 4th floor. All care partners are required to wear a mask the entire time (we do not have any that we can provide them), they need to practice social distancing, and we will do a Covid check for all patient's and care partners when you arrive. Also we will check their temperature and your temperature. If the care partner waits in their car they need to stay in the parking lot the entire time and we will call them on their cell phone when the patient is ready for discharge so they can bring the car to the front of the building. Also all patient's will need to wear a mask into building.  Sutab univ coupon to pt and to pharmacy

## 2020-03-07 ENCOUNTER — Telehealth: Payer: Self-pay | Admitting: Gastroenterology

## 2020-03-07 DIAGNOSIS — Z1211 Encounter for screening for malignant neoplasm of colon: Secondary | ICD-10-CM

## 2020-03-07 MED ORDER — SUTAB 1479-225-188 MG PO TABS: 24.0000 | ORAL_TABLET | ORAL | 0 refills | Status: DC

## 2020-03-10 ENCOUNTER — Encounter: Payer: Self-pay | Admitting: Family Medicine

## 2020-03-10 ENCOUNTER — Other Ambulatory Visit: Payer: Self-pay

## 2020-03-10 ENCOUNTER — Ambulatory Visit (INDEPENDENT_AMBULATORY_CARE_PROVIDER_SITE_OTHER): Payer: Medicare Other | Admitting: Family Medicine

## 2020-03-10 VITALS — BP 140/82 | HR 68 | Temp 97.9°F | Ht 73.75 in | Wt 295.1 lb

## 2020-03-10 DIAGNOSIS — Z1211 Encounter for screening for malignant neoplasm of colon: Secondary | ICD-10-CM

## 2020-03-10 DIAGNOSIS — D492 Neoplasm of unspecified behavior of bone, soft tissue, and skin: Secondary | ICD-10-CM | POA: Diagnosis not present

## 2020-03-10 DIAGNOSIS — Z0000 Encounter for general adult medical examination without abnormal findings: Secondary | ICD-10-CM | POA: Insufficient documentation

## 2020-03-10 DIAGNOSIS — Z131 Encounter for screening for diabetes mellitus: Secondary | ICD-10-CM

## 2020-03-10 DIAGNOSIS — Z7189 Other specified counseling: Secondary | ICD-10-CM | POA: Diagnosis not present

## 2020-03-10 DIAGNOSIS — Z23 Encounter for immunization: Secondary | ICD-10-CM | POA: Diagnosis not present

## 2020-03-10 DIAGNOSIS — E669 Obesity, unspecified: Secondary | ICD-10-CM | POA: Diagnosis not present

## 2020-03-10 DIAGNOSIS — R12 Heartburn: Secondary | ICD-10-CM

## 2020-03-10 DIAGNOSIS — Z0001 Encounter for general adult medical examination with abnormal findings: Secondary | ICD-10-CM | POA: Insufficient documentation

## 2020-03-10 DIAGNOSIS — Z13220 Encounter for screening for lipoid disorders: Secondary | ICD-10-CM | POA: Diagnosis not present

## 2020-03-10 LAB — HEPATIC FUNCTION PANEL
ALT: 21 U/L (ref 0–53)
AST: 19 U/L (ref 0–37)
Albumin: 4.5 g/dL (ref 3.5–5.2)
Alkaline Phosphatase: 60 U/L (ref 39–117)
Bilirubin, Direct: 0.1 mg/dL (ref 0.0–0.3)
Total Bilirubin: 0.4 mg/dL (ref 0.2–1.2)
Total Protein: 7.2 g/dL (ref 6.0–8.3)

## 2020-03-10 LAB — BASIC METABOLIC PANEL
BUN: 21 mg/dL (ref 6–23)
CO2: 33 mEq/L — ABNORMAL HIGH (ref 19–32)
Calcium: 9.1 mg/dL (ref 8.4–10.5)
Chloride: 102 mEq/L (ref 96–112)
Creatinine, Ser: 0.86 mg/dL (ref 0.40–1.50)
GFR: 88.92 mL/min (ref >60.00)
Glucose, Bld: 93 mg/dL (ref 70–99)
Potassium: 4.6 mEq/L (ref 3.5–5.1)
Sodium: 140 mEq/L (ref 135–145)

## 2020-03-10 LAB — TSH: TSH: 0.71 u[IU]/mL (ref 0.35–4.50)

## 2020-03-10 LAB — LIPID PANEL
Cholesterol: 176 mg/dL (ref 0–200)
HDL: 37.8 mg/dL — ABNORMAL LOW (ref >39.00)
LDL Cholesterol: 108 mg/dL — ABNORMAL HIGH (ref 0–99)
NonHDL: 138.03
Total CHOL/HDL Ratio: 5
Triglycerides: 151 mg/dL — ABNORMAL HIGH (ref 0.0–149.0)
VLDL: 30.2 mg/dL (ref 0.0–40.0)

## 2020-03-10 LAB — PSA, MEDICARE: PSA: 0.18 ng/ml (ref 0.10–4.00)

## 2020-03-10 NOTE — Assessment & Plan Note (Signed)
Advanced directive discussion - does not have. Packet provided. Would want wife or son to be HCPOA.

## 2020-03-10 NOTE — Progress Notes (Signed)
This visit was conducted in person.  BP 140/82 (BP Location: Left Arm, Patient Position: Sitting, Cuff Size: Large)   Pulse 68   Temp 97.9 F (36.6 C) (Temporal)   Ht 6' 1.75" (1.873 m)   Wt 295 lb 1 oz (133.8 kg)   SpO2 95%   BMI 38.14 kg/m    CC: new pt to establish care Subjective:    Patient ID: Brian Villanueva, male    DOB: 07/29/1956, 64 y.o.   MRN: [id number]  HPI: Brian Villanueva is a 64 y.o. male presenting on 03/10/2020 for Annual Exam (Prt 2. )   Saw health advisor last week for medicare wellness visit. Note reviewed.    No exam data present    Clinical Support from 03/03/2020 in [place] at [facility] Total Score  0      Fall Risk  03/03/2020  Falls in the past year? 0  Number falls in past yr: 0  Injury with Fall? 0  Risk for fall due to : No Fall Risks  Follow up Falls evaluation completed;Falls prevention discussed   Labs today.   Preventative: Colon cancer screening - colonoscopy scheduled next month Prostate cancer screening - asxs. Agrees to PSA and DRE today then likely space out.  Lung cancer screening - not eligible Flu shot - yearly Tetanus - unsure Pneumovax - today  COVID vaccine - pfizer completed 01/2020. Shingrix - discussed. To check with pharmacy.  Advanced directive discussion - does not have. Packet provided. Would want wife or son to be HCPOA.  Seat belt use discussed Sunscreen use discussed. No changing moles on skin.  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 Interim medical history since  our last visit reviewed. Allergies and medications reviewed and updated. Outpatient Medications Prior to Visit  Medication Sig Dispense Refill  . Cholecalciferol (VITAMIN D3) 50 MCG (2000 UT) TABS Take 1 tablet by mouth daily.    Marland Kitchen GLUCOSAMINE-CHONDROITIN PO Take 2 tablets by mouth daily. 1200 mg    . Multiple Vitamins-Minerals (CENTRUM SILVER 50+MEN PO) Take by mouth daily.    Marland Kitchen omeprazole (PRILOSEC) 20 MG capsule Take 1 capsule (20 mg total) by mouth daily as needed (heartburn).    . Sodium Sulfate-Mag Sulfate-KCl (SUTAB) [phone] MG TABS Take 24 tablets by mouth as directed. 24 tablet 0  . Sodium Sulfate-Mag Sulfate-KCl (SUTAB) [phone] MG TABS Take 24 tablets by mouth as directed. 24 tablet 0  . vitamin C (VITAMIN C) 500 MG tablet Take 2 tablets (1,000 mg total) by mouth daily.    . Zinc 22.5 MG TABS Take 1 tablet by mouth daily.     No facility-administered medications prior to visit.     Per HPI unless specifically indicated in ROS section below Review of Systems  Constitutional: Negative for activity change, appetite change, chills, fatigue, fever and unexpected weight change.  HENT: Positive for congestion (allergies). Negative for hearing loss.   Eyes: Negative for visual disturbance.  Respiratory: Negative for cough, chest tightness, shortness of breath and wheezing.   Cardiovascular: Negative for  chest pain, palpitations and leg swelling.  Gastrointestinal: Negative for abdominal distention, abdominal pain, blood in stool, constipation, diarrhea, nausea and vomiting.  Genitourinary: Negative for difficulty urinating and hematuria.  Musculoskeletal: Negative for arthralgias, myalgias and neck pain.  Skin: Negative for rash.  Neurological: Negative for dizziness, seizures, syncope and headaches.  Hematological: Negative for adenopathy. Does not bruise/bleed easily.  Psychiatric/Behavioral: Negative for dysphoric mood. The patient is not nervous/anxious.    Objective:    BP 140/82 (BP Location: Left Arm, Patient Position: Sitting, Cuff Size: Large)   Pulse 68   Temp 97.9 F (36.6 C) (Temporal)   Ht 6' 1.75" (1.873 m)   Wt 295 lb 1 oz (133.8 kg)   SpO2 95%   BMI 38.14 kg/m   Wt Readings from Last 3 Encounters:  03/10/20 295 lb 1 oz (133.8 kg)  03/03/20 291 lb (132 kg)  03/03/20 291 lb (132 kg)      Physical Exam Vitals and nursing note reviewed.  Constitutional:      General: He is not in acute distress.    Appearance: Normal appearance. He is well-developed. He is obese. He is not ill-appearing.  HENT:     Head:     Comments: Stuck on growth with mild hyperpigmentation to left cheek    Right Ear: Hearing, tympanic membrane, ear canal and external ear normal.     Left Ear: Hearing, tympanic membrane, ear canal and external ear normal.  Eyes:     General: No scleral icterus.    Extraocular Movements: Extraocular movements intact.     Conjunctiva/sclera: Conjunctivae normal.     Pupils: Pupils are equal, round, and reactive to light.  Cardiovascular:     Rate and Rhythm: Normal rate and regular rhythm.     Pulses: Normal pulses.          Radial pulses are 2+ on the right side and 2+ on the left side.     Heart sounds: Normal heart sounds. No murmur.  Pulmonary:     Effort: Pulmonary effort is normal. No respiratory distress.     Breath sounds: Normal breath sounds. No wheezing, rhonchi or rales.  Abdominal:     General: Abdomen is flat. Bowel sounds are normal. There is no distension.     Palpations: Abdomen is soft. There is no mass.     Tenderness: There is no abdominal tenderness. There is no guarding or rebound.     Hernia: No hernia is present.  Genitourinary:    Rectum: Normal. No mass, tenderness, anal fissure, external hemorrhoid or internal hemorrhoid. Normal anal tone.     Comments: Difficult to palpate prostate due to body habitus Musculoskeletal:        General: Normal range of motion.     Cervical back: Normal range of  motion and neck supple.     Right lower leg: No edema.     Left lower leg: No edema.  Lymphadenopathy:     Cervical: No cervical adenopathy.  Skin:    General: Skin is warm and dry.     Findings: No rash.  Neurological:     General: No focal deficit present.     Mental Status: He is alert and oriented to person, place, and time.     Comments: CN grossly intact, station and gait intact  Psychiatric:        Mood and Affect: Mood normal.        Behavior: Behavior normal.  Thought Content: Thought content normal.        Judgment: Judgment normal.       Results for orders placed or performed in visit on 09/04/19  Novel Coronavirus, NAA ([facility])   Specimen: Nasopharyngeal(NP) swabs in vial transport medium   NASOPHARYNGE  TESTING  Result Value Ref Range   SARS-CoV-2, NAA Not Detected Not Detected   Assessment & Plan:  This visit occurred during the SARS-CoV-2 public health emergency.  Safety protocols were in place, including screening questions prior to the visit, additional usage of staff PPE, and extensive cleaning of exam room while observing appropriate contact time as indicated for disinfecting solutions.   Problem List Items Addressed This Visit    Skin growth    Anticipate SK. Refer to derm.       Relevant Orders   Ambulatory referral to Dermatology   Obesity, Class II, BMI 35-39.9, no comorbidity    Encouraged healthy diet and lifestyle changes to affect sustainable weight loss.       Relevant Orders   TSH   Hepatic function panel   Heartburn    Managing with PRN PPI OTC      Health maintenance examination - Primary    Preventative protocols reviewed and updated unless pt declined. Discussed healthy diet and lifestyle.       Advanced directives, counseling/discussion    Advanced directive discussion - does not have. Packet provided. Would want wife or son to be HCPOA.        Other Visit Diagnoses    Special screening for malignant neoplasms, colon        Relevant Orders   PSA, Medicare   Diabetes mellitus screening       Relevant Orders   Basic metabolic panel   Lipid screening       Relevant Orders   Lipid panel   Need for 23-polyvalent pneumococcal polysaccharide vaccine       Relevant Orders   Pneumococcal polysaccharide vaccine 23-valent greater than or equal to 2yo subcutaneous/IM (Completed)       No orders of the defined types were placed in this encounter.  Orders Placed This Encounter  Procedures  . Pneumococcal polysaccharide vaccine 23-valent greater than or equal to 2yo subcutaneous/IM  . Lipid panel  . PSA, Medicare  . TSH  . Basic metabolic panel  . Hepatic function panel  . Ambulatory referral to Dermatology    Referral Priority:   Routine    Referral Type:   Consultation    Referral Reason:   Specialty Services Required    Requested Specialty:   Dermatology    Number of Visits Requested:   1    Follow up plan: Return in about 1 year (around 03/10/2021) for annual exam, prior fasting for blood work, medicare wellness visit.  Ria Bush, MD

## 2020-03-10 NOTE — Assessment & Plan Note (Signed)
Preventative protocols reviewed and updated unless pt declined. Discussed healthy diet and lifestyle.  

## 2020-03-10 NOTE — Patient Instructions (Addendum)
Labs today  Pneumovax today.  If interested, check with pharmacy about new 2 shot shingles series (shingrix).  Advanced directive packet provided today.  We will refer you to skin doctor to check spot on cheek.  You are doing well today  Return in 1 year for next wellness visit /physical.   Health Maintenance After Age 66 After age 71, you are at a higher risk for certain long-term diseases and infections as well as injuries from falls. Falls are a major cause of broken bones and head injuries in people who are older than age 35. Getting regular preventive care can help to keep you healthy and well. Preventive care includes getting regular testing and making lifestyle changes as recommended by your health care provider. Talk with your health care provider about:  Which screenings and tests you should have. A screening is a test that checks for a disease when you have no symptoms.  A diet and exercise plan that is right for you. What should I know about screenings and tests to prevent falls? Screening and testing are the best ways to find a health problem early. Early diagnosis and treatment give you the best chance of managing medical conditions that are common after age 59. Certain conditions and lifestyle choices may make you more likely to have a fall. Your health care provider may recommend:  Regular vision checks. Poor vision and conditions such as cataracts can make you more likely to have a fall. If you wear glasses, make sure to get your prescription updated if your vision changes.  Medicine review. Work with your health care provider to regularly review all of the medicines you are taking, including over-the-counter medicines. Ask your health care provider about any side effects that may make you more likely to have a fall. Tell your health care provider if any medicines that you take make you feel dizzy or sleepy.  Osteoporosis screening. Osteoporosis is a condition that causes the  bones to get weaker. This can make the bones weak and cause them to break more easily.  Blood pressure screening. Blood pressure changes and medicines to control blood pressure can make you feel dizzy.  Strength and balance checks. Your health care provider may recommend certain tests to check your strength and balance while standing, walking, or changing positions.  Foot health exam. Foot pain and numbness, as well as not wearing proper footwear, can make you more likely to have a fall.  Depression screening. You may be more likely to have a fall if you have a fear of falling, feel emotionally low, or feel unable to do activities that you used to do.  Alcohol use screening. Using too much alcohol can affect your balance and may make you more likely to have a fall. What actions can I take to lower my risk of falls? General instructions  Talk with your health care provider about your risks for falling. Tell your health care provider if: ? You fall. Be sure to tell your health care provider about all falls, even ones that seem minor. ? You feel dizzy, sleepy, or off-balance.  Take over-the-counter and prescription medicines only as told by your health care provider. These include any supplements.  Eat a healthy diet and maintain a healthy weight. A healthy diet includes low-fat dairy products, low-fat (lean) meats, and fiber from whole grains, beans, and lots of fruits and vegetables. Home safety  Remove any tripping hazards, such as rugs, cords, and clutter.  Install safety equipment  such as grab bars in bathrooms and safety rails on stairs.  Keep rooms and walkways well-lit. Activity   Follow a regular exercise program to stay fit. This will help you maintain your balance. Ask your health care provider what types of exercise are appropriate for you.  If you need a cane or walker, use it as recommended by your health care provider.  Wear supportive shoes that have nonskid  soles. Lifestyle  Do not drink alcohol if your health care provider tells you not to drink.  If you drink alcohol, limit how much you have: ? 0-1 drink a day for women. ? 0-2 drinks a day for men.  Be aware of how much alcohol is in your drink. In the [place] one drink equals one typical bottle of beer (12 oz), one-half glass of wine (5 oz), or one shot of hard liquor (1 oz).  Do not use any products that contain nicotine or tobacco, such as cigarettes and e-cigarettes. If you need help quitting, ask your health care provider. Summary  Having a healthy lifestyle and getting preventive care can help to protect your health and wellness after age 71.  Screening and testing are the best way to find a health problem early and help you avoid having a fall. Early diagnosis and treatment give you the best chance for managing medical conditions that are more common for people who are older than age 52.  Falls are a major cause of broken bones and head injuries in people who are older than age 18. Take precautions to prevent a fall at home.  Work with your health care provider to learn what changes you can make to improve your health and wellness and to prevent falls. This information is not intended to replace advice given to you by your health care provider. Make sure you discuss any questions you have with your health care provider. Document Revised: 02/19/2019 Document Reviewed: 09/11/2017 Elsevier Patient Education  2020 [employer].

## 2020-03-10 NOTE — Assessment & Plan Note (Addendum)
Managing with PRN PPI OTC

## 2020-03-10 NOTE — Assessment & Plan Note (Signed)
Anticipate SK. Refer to derm.

## 2020-03-10 NOTE — Assessment & Plan Note (Signed)
Encouraged healthy diet and lifestyle changes to affect sustainable weight loss.  

## 2020-03-12 HISTORY — PX: COLONOSCOPY: SHX174

## 2020-03-13 ENCOUNTER — Encounter: Payer: Self-pay | Admitting: Family Medicine

## 2020-03-13 DIAGNOSIS — E785 Hyperlipidemia, unspecified: Secondary | ICD-10-CM

## 2020-03-13 HISTORY — DX: Hyperlipidemia, unspecified: E78.5

## 2020-03-17 ENCOUNTER — Ambulatory Visit (AMBULATORY_SURGERY_CENTER): Payer: Medicare Other | Admitting: Gastroenterology

## 2020-03-17 ENCOUNTER — Encounter: Payer: Self-pay | Admitting: Gastroenterology

## 2020-03-17 ENCOUNTER — Other Ambulatory Visit: Payer: Self-pay

## 2020-03-17 VITALS — BP 136/77 | HR 66 | Temp 96.8°F | Resp 20 | Ht 73.75 in | Wt 291.0 lb

## 2020-03-17 DIAGNOSIS — Z1211 Encounter for screening for malignant neoplasm of colon: Secondary | ICD-10-CM | POA: Diagnosis not present

## 2020-03-17 DIAGNOSIS — D127 Benign neoplasm of rectosigmoid junction: Secondary | ICD-10-CM

## 2020-03-17 MED ORDER — SODIUM CHLORIDE 0.9 % IV SOLN
500.0000 mL | Freq: Once | INTRAVENOUS | Status: DC
Start: 2020-03-17 — End: 2020-03-17

## 2020-03-17 NOTE — Op Note (Signed)
[place] Patient Name: Brian Villanueva Procedure Date: 03/17/2020 9:39 AM MRN: [id number] Endoscopist: Mauri Pole , MD Age: 64 Referring MD:  Date of Birth: [birth year] Gender: Male Account #: [id number] Procedure:                Colonoscopy Indications:              Screening for colorectal malignant neoplasm Medicines:                Monitored Anesthesia Care Procedure:                Pre-Anesthesia Assessment:                           - Prior to the procedure, a History and Physical                            was performed, and patient medications and                            allergies were reviewed. The patient's tolerance of                            previous anesthesia was also reviewed. The risks                            and benefits of the procedure and the sedation                            options and risks were discussed with the patient.                            All questions were answered, and informed consent                            was obtained. Prior Anticoagulants: The patient has                            taken no previous anticoagulant or antiplatelet                            agents. ASA Grade Assessment: II - A patient with                            mild systemic disease. After reviewing the risks                            and benefits, the patient was deemed in                            satisfactory condition to undergo the procedure.                           After obtaining informed consent, the colonoscope  was passed under direct vision. Throughout the                            procedure, the patient's blood pressure, pulse, and                            oxygen saturations were monitored continuously. The                            Colonoscope was introduced through the anus and                            advanced to the the cecum, identified by                            appendiceal orifice and  ileocecal valve. The                            colonoscopy was performed without difficulty. The                            patient tolerated the procedure well. The quality                            of the bowel preparation was excellent. The                            ileocecal valve, appendiceal orifice, and rectum                            were photographed. Scope In: 9:45:37 AM Scope Out: 10:01:55 AM Scope Withdrawal Time: 0 hours 11 minutes 59 seconds  Total Procedure Duration: 0 hours 16 minutes 18 seconds  Findings:                 The perianal and digital rectal examinations were                            normal.                           A 5 mm polyp was found in the recto-sigmoid colon.                            The polyp was sessile. The polyp was removed with a                            cold snare. Resection and retrieval were complete.                           A less than 1 mm polyp was found in the                            recto-sigmoid colon. The polyp was sessile. The  polyp was removed with a cold biopsy forceps.                            Resection and retrieval were complete.                           Non-bleeding internal hemorrhoids were found during                            retroflexion. The hemorrhoids were small.                           The exam was otherwise without abnormality. Complications:            No immediate complications. Estimated Blood Loss:     Estimated blood loss was minimal. Impression:               - One 5 mm polyp at the recto-sigmoid colon,                            removed with a cold snare. Resected and retrieved.                           - One less than 1 mm polyp at the recto-sigmoid                            colon, removed with a cold biopsy forceps. Resected                            and retrieved.                           - Non-bleeding internal hemorrhoids.                           - The  examination was otherwise normal. Recommendation:           - Patient has a contact number available for                            emergencies. The signs and symptoms of potential                            delayed complications were discussed with the                            patient. Return to normal activities tomorrow.                            Written discharge instructions were provided to the                            patient.                           - Resume previous diet.                           -  Continue present medications.                           - Await pathology results.                           - Repeat colonoscopy in 5-10 years for surveillance                            based on pathology results. Mauri Pole, MD 03/17/2020 10:07:00 AM This report has been signed electronically.

## 2020-03-17 NOTE — Patient Instructions (Signed)
Please read handouts provided. Continue present medications. Await pathology results.   YOU HAD AN ENDOSCOPIC PROCEDURE TODAY AT THE [facility]:   Refer to the procedure report that was given to you for any specific questions about what was found during the examination.  If the procedure report does not answer your questions, please call your gastroenterologist to clarify.  If you requested that your care partner not be given the details of your procedure findings, then the procedure report has been included in a sealed envelope for you to review at your convenience later.  YOU SHOULD EXPECT: Some feelings of bloating in the abdomen. Passage of more gas than usual.  Walking can help get rid of the air that was put into your GI tract during the procedure and reduce the bloating. If you had a lower endoscopy (such as a colonoscopy or flexible sigmoidoscopy) you may notice spotting of blood in your stool or on the toilet paper. If you underwent a bowel prep for your procedure, you may not have a normal bowel movement for a few days.  Please Note:  You might notice some irritation and congestion in your nose or some drainage.  This is from the oxygen used during your procedure.  There is no need for concern and it should clear up in a day or so.  SYMPTOMS TO REPORT IMMEDIATELY:  Following lower endoscopy (colonoscopy or flexible sigmoidoscopy):  Excessive amounts of blood in the stool  Significant tenderness or worsening of abdominal pains  Swelling of the abdomen that is new, acute  Fever of 100F or higher   For urgent or emergent issues, a gastroenterologist can be reached at any hour by calling [phone]. Do not use MyChart messaging for urgent concerns.    DIET:  We do recommend a small meal at first, but then you may proceed to your regular diet.  Drink plenty of fluids but you should avoid alcoholic beverages for 24 hours.  ACTIVITY:  You should plan to take it easy  for the rest of today and you should NOT DRIVE or use heavy machinery until tomorrow (because of the sedation medicines used during the test).    FOLLOW UP: Our staff will call the number listed on your records 48-72 hours following your procedure to check on you and address any questions or concerns that you may have regarding the information given to you following your procedure. If we do not reach you, we will leave a message.  We will attempt to reach you two times.  During this call, we will ask if you have developed any symptoms of COVID 19. If you develop any symptoms (ie: fever, flu-like symptoms, shortness of breath, cough etc.) before then, please call [phone].  If you test positive for Covid 19 in the 2 weeks post procedure, please call and report this information to us.    If any biopsies were taken you will be contacted by phone or by letter within the next 1-3 weeks.  Please call us at [phone] if you have not heard about the biopsies in 3 weeks.    SIGNATURES/CONFIDENTIALITY: You and/or your care partner have signed paperwork which will be entered into your electronic medical record.  These signatures attest to the fact that that the information above on your After Visit Summary has been reviewed and is understood.  Full responsibility of the confidentiality of this discharge information lies with you and/or your care-partner.  

## 2020-03-17 NOTE — Progress Notes (Signed)
To PACU, VSS report to [email or web address]

## 2020-03-17 NOTE — Progress Notes (Signed)
Temp JB VS DT  Pt's states no medical or surgical changes since previsit or office visit. 

## 2020-03-18 ENCOUNTER — Telehealth: Payer: Self-pay | Admitting: Dermatology

## 2020-03-18 NOTE — Telephone Encounter (Signed)
Patient returning call to set up an appointment. Patient has a referral and says to call his office number [phone] ([place]) on Monday; patient says he will be in the office all day on Monday and easier to get in touch with.  Patient says to call his cell number if not able to get through on his office number.

## 2020-03-21 ENCOUNTER — Telehealth: Payer: Self-pay

## 2020-03-21 NOTE — Telephone Encounter (Signed)
  Follow up Call-  Call back number 03/17/2020  Post procedure Call Back phone  # [phone]  Permission to leave phone message Yes  Some recent data might be hidden     Patient questions:  Do you have a fever, pain , or abdominal swelling? No. Pain Score  0 *  Have you tolerated food without any problems? Yes.    Have you been able to return to your normal activities? Yes.    Do you have any questions about your discharge instructions: Diet   No. Medications  No. Follow up visit  No.  Do you have questions or concerns about your Care? No.  Actions: * If pain score is 4 or above: No action needed, pain <4.  1. Have you developed a fever since your procedure? no  2.   Have you had an respiratory symptoms (SOB or cough) since your procedure? no  3.   Have you tested positive for COVID 19 since your procedure no  4.   Have you had any family members/close contacts diagnosed with the COVID 19 since your procedure?  no   If yes to any of these questions please route to Joylene John, RN and Erenest Rasher, RN

## 2020-03-31 ENCOUNTER — Encounter: Payer: Self-pay | Admitting: Gastroenterology

## 2020-06-14 ENCOUNTER — Ambulatory Visit: Payer: Medicare Other | Admitting: Physician Assistant

## 2020-06-15 ENCOUNTER — Ambulatory Visit: Payer: Medicare Other | Admitting: Physician Assistant

## 2020-08-12 DIAGNOSIS — M1712 Unilateral primary osteoarthritis, left knee: Secondary | ICD-10-CM | POA: Diagnosis not present

## 2020-09-02 DIAGNOSIS — M1712 Unilateral primary osteoarthritis, left knee: Secondary | ICD-10-CM | POA: Diagnosis not present

## 2020-09-07 DIAGNOSIS — M1712 Unilateral primary osteoarthritis, left knee: Secondary | ICD-10-CM | POA: Diagnosis not present

## 2020-09-14 DIAGNOSIS — M1712 Unilateral primary osteoarthritis, left knee: Secondary | ICD-10-CM | POA: Diagnosis not present

## 2020-09-20 DIAGNOSIS — D1801 Hemangioma of skin and subcutaneous tissue: Secondary | ICD-10-CM | POA: Diagnosis not present

## 2020-09-20 DIAGNOSIS — D224 Melanocytic nevi of scalp and neck: Secondary | ICD-10-CM | POA: Diagnosis not present

## 2020-09-20 DIAGNOSIS — L814 Other melanin hyperpigmentation: Secondary | ICD-10-CM | POA: Diagnosis not present

## 2020-09-20 DIAGNOSIS — L565 Disseminated superficial actinic porokeratosis (DSAP): Secondary | ICD-10-CM | POA: Diagnosis not present

## 2020-09-20 DIAGNOSIS — L820 Inflamed seborrheic keratosis: Secondary | ICD-10-CM | POA: Diagnosis not present

## 2020-09-20 DIAGNOSIS — D225 Melanocytic nevi of trunk: Secondary | ICD-10-CM | POA: Diagnosis not present

## 2020-09-21 DIAGNOSIS — M1712 Unilateral primary osteoarthritis, left knee: Secondary | ICD-10-CM | POA: Diagnosis not present

## 2020-10-19 ENCOUNTER — Ambulatory Visit: Payer: BLUE CROSS/BLUE SHIELD | Admitting: Podiatry

## 2020-11-16 ENCOUNTER — Other Ambulatory Visit: Payer: Self-pay

## 2020-11-16 ENCOUNTER — Ambulatory Visit (INDEPENDENT_AMBULATORY_CARE_PROVIDER_SITE_OTHER): Payer: Medicare Other | Admitting: Podiatry

## 2020-11-16 DIAGNOSIS — M778 Other enthesopathies, not elsewhere classified: Secondary | ICD-10-CM | POA: Diagnosis not present

## 2020-11-16 DIAGNOSIS — L84 Corns and callosities: Secondary | ICD-10-CM

## 2020-11-16 DIAGNOSIS — M779 Enthesopathy, unspecified: Secondary | ICD-10-CM

## 2020-11-16 MED ORDER — TRIAMCINOLONE ACETONIDE 10 MG/ML IJ SUSP
10.0000 mg | Freq: Once | INTRAMUSCULAR | Status: AC
  Administered 2020-11-16: 10 mg

## 2020-11-17 NOTE — Progress Notes (Signed)
Subjective:   Patient ID: Brian Villanueva, male   DOB: 65 y.o.   MRN: [id number]   HPI Patient states has had a lot of pain in the outside of the left foot with fluid buildup inflammation feels like he is walking on a inflamed area.  States is been this way for around 6 months and has not done anything and he currently does not smoke and likes to be active   Review of Systems  All other systems reviewed and are negative.       Objective:  Physical Exam Vitals and nursing note reviewed.  Constitutional:      Appearance: He is well-developed and well-nourished.  Cardiovascular:     Pulses: Intact distal pulses.  Pulmonary:     Effort: Pulmonary effort is normal.  Musculoskeletal:        General: Normal range of motion.  Skin:    General: Skin is warm.  Neurological:     Mental Status: He is alert.     Neurovascular status intact muscle strength found to be adequate range of motion adequate with inflammation fluid around the fifth MPJ left that is painful and keratotic lesion with porokeratotic lucent core fifth metatarsal left painful     Assessment:  Inflammatory capsulitis fifth MPJ left along with keratotic lesion formation eight     Plan:  P reviewed condition sterile prep done and went ahead and injected the fifth MPJ 3 mg Dexasone Kenalog 5 mg Xylocaine debrided lesions advised on reduced activity and discussed possible met head resection in future

## 2021-03-06 ENCOUNTER — Ambulatory Visit: Payer: Medicare Other

## 2021-03-08 ENCOUNTER — Ambulatory Visit (INDEPENDENT_AMBULATORY_CARE_PROVIDER_SITE_OTHER): Payer: Medicare Other

## 2021-03-08 DIAGNOSIS — Z0000 Encounter for general adult medical examination without abnormal findings: Secondary | ICD-10-CM | POA: Diagnosis not present

## 2021-03-08 NOTE — Progress Notes (Signed)
Subjective:   Brian Villanueva is a 65 y.o. male who presents for Medicare Annual/Subsequent preventive examination.  Review of Systems: N/A      I connected with the patient today by telephone and verified that I am speaking with the correct person using two identifiers. Location patient: home Location nurse: work Persons participating in the telephone visit: patient, nurse.   I discussed the limitations, risks, security and privacy concerns of performing an evaluation and management service by telephone and the availability of in person appointments. I also discussed with the patient that there may be a patient responsible charge related to this service. The patient expressed understanding and verbally consented to this telephonic visit.        Cardiac Risk Factors include: advanced age (>41men, >56 women);male gender;Other (see comment), Risk factor comments: hyperlipidemia     Objective:    Today's Vitals   There is no height or weight on file to calculate BMI.  Advanced Directives 03/08/2021 03/03/2020  Does Patient Have a Medical Advance Directive? No No  Would patient like information on creating a medical advance directive? No - Patient declined No - Patient declined    Current Medications (verified) Outpatient Encounter Medications as of 03/08/2021  Medication Sig  . Cholecalciferol (VITAMIN D3) 50 MCG (2000 UT) TABS Take 1 tablet by mouth daily.  Marland Kitchen GLUCOSAMINE-CHONDROITIN PO Take 2 tablets by mouth daily. 1200 mg  . Multiple Vitamins-Minerals (CENTRUM SILVER 50+MEN PO) Take by mouth daily.  Marland Kitchen omeprazole (PRILOSEC) 20 MG capsule Take 1 capsule (20 mg total) by mouth daily as needed (heartburn).  . vitamin C (VITAMIN C) 500 MG tablet Take 2 tablets (1,000 mg total) by mouth daily.  . Zinc 22.5 MG TABS Take 1 tablet by mouth daily.   No facility-administered encounter medications on file as of 03/08/2021.    Allergies (verified) Patient has no known allergies.    History: Past Medical History:  Diagnosis Date  . Arthritis   . GERD (gastroesophageal reflux disease)    Past Surgical History:  Procedure Laterality Date  . kidney stone removal     20+ years ago   . KNEE SURGERY Left    x2 growing up - ligament tears  . SPINAL FUSION    . WISDOM TOOTH EXTRACTION     Family History  Problem Relation Age of Onset  . Cancer Mother        throat  . Esophageal cancer Mother   . High Cholesterol Father   . High blood pressure Father   . Atrial fibrillation Father   . Arthritis Maternal Grandfather   . Cancer Paternal Grandmother   . Colon polyps Neg Hx   . Colon cancer Neg Hx   . Rectal cancer Neg Hx   . Stomach cancer Neg Hx    Social History   Socioeconomic History  . Marital status: Married    Spouse name: Not on file  . Number of children: Not on file  . Years of education: Not on file  . Highest education level: Not on file  Occupational History  . Not on file  Tobacco Use  . Smoking status: Light Tobacco Smoker    Types: Cigars  . Smokeless tobacco: Never Used  Substance and Sexual Activity  . Alcohol use: Yes    Comment: social - a few a month  . Drug use: Never  . Sexual activity: Yes    Partners: Female  Other Topics Concern  . 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 Difficulty of Paying Living Expenses: Not hard at all  Food Insecurity: No Food Insecurity  . Worried About Charity fundraiser in the Last Year: Never true  . Ran Out of Food in the Last Year: Never true  Transportation Needs: No Transportation Needs  . Lack of Transportation (Medical): No  . Lack of Transportation (Non-Medical): No  Physical Activity: Inactive  . Days of Exercise  per Week: 0 days  . Minutes of Exercise per Session: 0 min  Stress: No Stress Concern Present  . Feeling of Stress : Not at all  Social Connections: Not on file    Tobacco Counseling Ready to quit: Not Answered Counseling given: Not Answered   Clinical Intake:  Pre-visit preparation completed: Yes  Pain : No/denies pain     Nutritional Risks: None Diabetes: No  How often do you need to have someone help you when you read instructions, pamphlets, or other written materials from your doctor or pharmacy?: 1 - Never  Diabetic: No Nutrition Risk Assessment:  Has the patient had any N/V/D within the last 2 months?  No  Does the patient have any non-healing wounds?  No  Has the patient had any unintentional weight loss or weight gain?  No   Diabetes:  Is the patient diabetic?  No  If diabetic, was a CBG obtained today?  N/A Did the patient bring in their glucometer from home?  N/A How often do you monitor your CBG's? N/A.   Financial Strains and Diabetes Management:  Are you having any financial strains with the device, your supplies or your medication? N/A.  Does the patient want to be seen by Chronic Care Management for management of their diabetes?  N/A Would the patient like to be referred to a Nutritionist or for Diabetic Management?  N/A  Interpreter Needed?: No  Information entered by :: CJohnson, LPN   Activities of Daily Living In your present state of health, do you have any difficulty performing the following activities: 03/08/2021  Hearing? N  Vision? N  Difficulty concentrating or making decisions? N  Walking or climbing stairs? N  Dressing or bathing? N  Doing errands, shopping? N  Preparing Food and eating ? N  Using the Toilet? N  In the past six months, have you accidently leaked urine? N  Do you have problems with loss of bowel control? N  Managing your Medications? N  Managing your Finances? N  Housekeeping or managing your Housekeeping? N   Some recent data might be hidden    Patient Care Team: Ria Bush, MD as PCP - General (Family Medicine)  Indicate any recent Medical Services you may have received from other than [facility] providers in the past year (date may be approximate).     Assessment:   This is a routine wellness examination for Tyrike.  Hearing/Vision screen  Hearing Screening   125Hz  250Hz  500Hz  1000Hz  2000Hz  3000Hz  4000Hz  6000Hz  8000Hz   Right ear:           Left ear:           Vision Screening Comments: Patient get annual eye exams   Dietary issues and exercise  activities discussed: Current Exercise Habits: The patient does not participate in regular exercise at present, Exercise limited by: None identified  Goals    . Patient Stated     03/03/2020, I will maintain and continue medications as prescribed.     . Patient Stated     03/08/2021, I will maintain and continue medications as prescribed.      Depression Screen PHQ 2/9 Scores 03/08/2021 03/03/2020 02/22/2020  PHQ - 2 Score 0 0 0  PHQ- 9 Score 0 0 2    Fall Risk Fall Risk  03/08/2021 03/03/2020  Falls in the past year? 0 0  Number falls in past yr: 0 0  Injury with Fall? 0 0  Risk for fall due to : No Fall Risks No Fall Risks  Follow up Falls evaluation completed;Falls prevention discussed Falls evaluation completed;Falls prevention discussed    FALL RISK PREVENTION PERTAINING TO THE HOME:  Any stairs in or around the home? Yes  If so, are there any without handrails? No  Home free of loose throw rugs in walkways, pet beds, electrical cords, etc? Yes  Adequate lighting in your home to reduce risk of falls? Yes   ASSISTIVE DEVICES UTILIZED TO PREVENT FALLS:  Life alert? No  Use of a cane, walker or w/c? No  Grab bars in the bathroom? No  Shower chair or bench in shower? No  Elevated toilet seat or a handicapped toilet? No   TIMED UP AND GO:  Was the test performed? N/A telephone visit .    Cognitive Function: MMSE - Mini  Mental State Exam 03/08/2021 03/03/2020  Not completed: Refused -  Orientation to time - 5  Orientation to Place - 5  Registration - 3  Attention/ Calculation - 5  Recall - 3  Language- repeat - 1  Mini Cog  Mini-Cog screen was not completed. Patient refused. Maximum score is 22. A value of 0 denotes this part of the MMSE was not completed or the patient failed this part of the Mini-Cog screening.       Immunizations Immunization History  Administered Date(s) Administered  . Influenza-Unspecified 07/31/2019  . PFIZER(Purple Top)SARS-COV-2 Vaccination 12/25/2019, 01/19/2020  . Pneumococcal Polysaccharide-23 03/10/2020    TDAP status: Due, Education has been provided regarding the importance of this vaccine. Advised may receive this vaccine at local pharmacy or Health Dept. Aware to provide a copy of the vaccination record if obtained from local pharmacy or Health Dept. Verbalized acceptance and understanding.  Flu Vaccine status: due Fall 2022  Pneumococcal vaccine status: Up to date  Covid-19 vaccine status: Completed vaccines  Qualifies for Shingles Vaccine? Yes   Zostavax completed No   Shingrix Completed?: No.    Education has been provided regarding the importance of this vaccine. Patient has been advised to call insurance company to determine out of pocket expense if they have not yet received this vaccine. Advised may also receive vaccine at local pharmacy or Health Dept. Verbalized acceptance and understanding.  Screening Tests Health Maintenance  Topic Date Due  . Hepatitis C Screening  Never done  . COVID-19 Vaccine (3 - Booster for Pfizer series) 07/21/2020  . TETANUS/TDAP  02/11/2024 (Originally 12/27/1974)  . PNA vac Low Risk Adult (2 of 2 - PCV13) 03/10/2021  . INFLUENZA VACCINE  06/12/2021  . COLONOSCOPY (Pts 45-16yrs Insurance coverage will need to be confirmed)  03/17/2030  . HPV VACCINES  Aged Out    Health Maintenance  Health Maintenance Due  Topic  Date  Due  . Hepatitis C Screening  Never done  . COVID-19 Vaccine (3 - Booster for Pfizer series) 07/21/2020    Colorectal cancer screening: Type of screening: Colonoscopy. Completed 03/17/2020. Repeat every 10 years  Lung Cancer Screening: (Low Dose CT Chest recommended if Age 84-80 years, 30 pack-year currently smoking OR have quit w/in 15 years.) does not qualify.    Additional Screening:  Hepatitis C Screening: does qualify; Completed due  Vision Screening: Recommended annual ophthalmology exams for early detection of glaucoma and other disorders of the eye. Is the patient up to date with their annual eye exam?  Yes  Who is the provider or what is the name of the office in which the patient attends annual eye exams? [facility]  If pt is not established with a provider, would they like to be referred to a provider to establish care? No .   Dental Screening: Recommended annual dental exams for proper oral hygiene  Community Resource Referral / Chronic Care Management: CRR required this visit?  No   CCM required this visit?  No      Plan:     I have personally reviewed and noted the following in the patient's chart:   . Medical and social history . Use of alcohol, tobacco or illicit drugs  . Current medications and supplements . Functional ability and status . Nutritional status . Physical activity . Advanced directives . List of other physicians . Hospitalizations, surgeries, and ER visits in previous 12 months . Vitals . Screenings to include cognitive, depression, and falls . Referrals and appointments  In addition, I have reviewed and discussed with patient certain preventive protocols, quality metrics, and best practice recommendations. A written personalized care plan for preventive services as well as general preventive health recommendations were provided to patient.   Due to this being a telephonic visit, the after visit summary with patients personalized  plan was offered to patient via office or my-chart. Patient preferred to pick up at office at next visit or via mychart.   Andrez Grime, LPN   7/59/1638

## 2021-03-08 NOTE — Patient Instructions (Signed)
Brian Villanueva , Thank you for taking time to come for your Medicare Wellness Visit. I appreciate your ongoing commitment to your health goals. Please review the following plan we discussed and let me know if I can assist you in the future.   Screening recommendations/referrals: Colonoscopy: Up to date, completed 03/17/2020, due 03/2030 Recommended yearly ophthalmology/optometry visit for glaucoma screening and checkup Recommended yearly dental visit for hygiene and checkup  Vaccinations: Influenza vaccine: due Fall 2022 Pneumococcal vaccine: Up to date, completed 03/10/2020, due 03/10/2021 Tdap vaccine: decline-insurance  Shingles vaccine: due, check with your insurance regarding coverage if interested    Covid-19: Completed vaccines, please bring your card to appointment so we can document dates in your chart  Advanced directives: Advance directive discussed with you today. Even though you declined this today please call our office should you change your mind and we can give you the proper paperwork for you to fill out.  Conditions/risks identified: hyperlipidemia   Next appointment: Follow up in one year for your annual wellness visit.   Preventive Care 67 Years and Older, Male Preventive care refers to lifestyle choices and visits with your health care provider that can promote health and wellness. What does preventive care include?  A yearly physical exam. This is also called an annual well check.  Dental exams once or twice a year.  Routine eye exams. Ask your health care provider how often you should have your eyes checked.  Personal lifestyle choices, including:  Daily care of your teeth and gums.  Regular physical activity.  Eating a healthy diet.  Avoiding tobacco and drug use.  Limiting alcohol use.  Practicing safe sex.  Taking low doses of aspirin every day.  Taking vitamin and mineral supplements as recommended by your health care provider. What happens during an  annual well check? The services and screenings done by your health care provider during your annual well check will depend on your age, overall health, lifestyle risk factors, and family history of disease. Counseling  Your health care provider may ask you questions about your:  Alcohol use.  Tobacco use.  Drug use.  Emotional well-being.  Home and relationship well-being.  Sexual activity.  Eating habits.  History of falls.  Memory and ability to understand (cognition).  Work and work Statistician. Screening  You may have the following tests or measurements:  Height, weight, and BMI.  Blood pressure.  Lipid and cholesterol levels. These may be checked every 5 years, or more frequently if you are over 51 years old.  Skin check.  Lung cancer screening. You may have this screening every year starting at age 8 if you have a 30-pack-year history of smoking and currently smoke or have quit within the past 15 years.  Fecal occult blood test (FOBT) of the stool. You may have this test every year starting at age 83.  Flexible sigmoidoscopy or colonoscopy. You may have a sigmoidoscopy every 5 years or a colonoscopy every 10 years starting at age 3.  Prostate cancer screening. Recommendations will vary depending on your family history and other risks.  Hepatitis C blood test.  Hepatitis B blood test.  Sexually transmitted disease (STD) testing.  Diabetes screening. This is done by checking your blood sugar (glucose) after you have not eaten for a while (fasting). You may have this done every 1-3 years.  Abdominal aortic aneurysm (AAA) screening. You may need this if you are a current or former smoker.  Osteoporosis. You may be screened starting  at age 73 if you are at high risk. Talk with your health care provider about your test results, treatment options, and if necessary, the need for more tests. Vaccines  Your health care provider may recommend certain vaccines,  such as:  Influenza vaccine. This is recommended every year.  Tetanus, diphtheria, and acellular pertussis (Tdap, Td) vaccine. You may need a Td booster every 10 years.  Zoster vaccine. You may need this after age 41.  Pneumococcal 13-valent conjugate (PCV13) vaccine. One dose is recommended after age 67.  Pneumococcal polysaccharide (PPSV23) vaccine. One dose is recommended after age 74. Talk to your health care provider about which screenings and vaccines you need and how often you need them. This information is not intended to replace advice given to you by your health care provider. Make sure you discuss any questions you have with your health care provider. Document Released: 11/25/2015 Document Revised: 07/18/2016 Document Reviewed: 08/30/2015 Elsevier Interactive Patient Education  2017 [place] Prevention in the Home Falls can cause injuries. They can happen to people of all ages. There are many things you can do to make your home safe and to help prevent falls. What can I do on the outside of my home?  Regularly fix the edges of walkways and driveways and fix any cracks.  Remove anything that might make you trip as you walk through a door, such as a raised step or threshold.  Trim any bushes or trees on the path to your home.  Use bright outdoor lighting.  Clear any walking paths of anything that might make someone trip, such as rocks or tools.  Regularly check to see if handrails are loose or broken. Make sure that both sides of any steps have handrails.  Any raised decks and porches should have guardrails on the edges.  Have any leaves, snow, or ice cleared regularly.  Use sand or salt on walking paths during winter.  Clean up any spills in your garage right away. This includes oil or grease spills. What can I do in the bathroom?  Use night lights.  Install grab bars by the toilet and in the tub and shower. Do not use towel bars as grab bars.  Use  non-skid mats or decals in the tub or shower.  If you need to sit down in the shower, use a plastic, non-slip stool.  Keep the floor dry. Clean up any water that spills on the floor as soon as it happens.  Remove soap buildup in the tub or shower regularly.  Attach bath mats securely with double-sided non-slip rug tape.  Do not have throw rugs and other things on the floor that can make you trip. What can I do in the bedroom?  Use night lights.  Make sure that you have a light by your bed that is easy to reach.  Do not use any sheets or blankets that are too big for your bed. They should not hang down onto the floor.  Have a firm chair that has side arms. You can use this for support while you get dressed.  Do not have throw rugs and other things on the floor that can make you trip. What can I do in the kitchen?  Clean up any spills right away.  Avoid walking on wet floors.  Keep items that you use a lot in easy-to-reach places.  If you need to reach something above you, use a strong step stool that has a grab bar.  Keep electrical cords out of the way.  Do not use floor polish or wax that makes floors slippery. If you must use wax, use non-skid floor wax.  Do not have throw rugs and other things on the floor that can make you trip. What can I do with my stairs?  Do not leave any items on the stairs.  Make sure that there are handrails on both sides of the stairs and use them. Fix handrails that are broken or loose. Make sure that handrails are as long as the stairways.  Check any carpeting to make sure that it is firmly attached to the stairs. Fix any carpet that is loose or worn.  Avoid having throw rugs at the top or bottom of the stairs. If you do have throw rugs, attach them to the floor with carpet tape.  Make sure that you have a light switch at the top of the stairs and the bottom of the stairs. If you do not have them, ask someone to add them for you. What  else can I do to help prevent falls?  Wear shoes that:  Do not have high heels.  Have rubber bottoms.  Are comfortable and fit you well.  Are closed at the toe. Do not wear sandals.  If you use a stepladder:  Make sure that it is fully opened. Do not climb a closed stepladder.  Make sure that both sides of the stepladder are locked into place.  Ask someone to hold it for you, if possible.  Clearly mark and make sure that you can see:  Any grab bars or handrails.  First and last steps.  Where the edge of each step is.  Use tools that help you move around (mobility aids) if they are needed. These include:  Canes.  Walkers.  Scooters.  Crutches.  Turn on the lights when you go into a dark area. Replace any light bulbs as soon as they burn out.  Set up your furniture so you have a clear path. Avoid moving your furniture around.  If any of your floors are uneven, fix them.  If there are any pets around you, be aware of where they are.  Review your medicines with your doctor. Some medicines can make you feel dizzy. This can increase your chance of falling. Ask your doctor what other things that you can do to help prevent falls. This information is not intended to replace advice given to you by your health care provider. Make sure you discuss any questions you have with your health care provider. Document Released: 08/25/2009 Document Revised: 04/05/2016 Document Reviewed: 12/03/2014 Elsevier Interactive Patient Education  2017 [employer].

## 2021-03-08 NOTE — Progress Notes (Signed)
PCP notes:  Health Maintenance: No gaps noted    Abnormal Screenings: none   Patient concerns: none   Nurse concerns: none   Next PCP appt.: 03/17/2021 @ 10 am

## 2021-03-10 ENCOUNTER — Other Ambulatory Visit: Payer: Self-pay | Admitting: Family Medicine

## 2021-03-10 DIAGNOSIS — Z125 Encounter for screening for malignant neoplasm of prostate: Secondary | ICD-10-CM

## 2021-03-10 DIAGNOSIS — Z1159 Encounter for screening for other viral diseases: Secondary | ICD-10-CM

## 2021-03-10 DIAGNOSIS — E785 Hyperlipidemia, unspecified: Secondary | ICD-10-CM

## 2021-03-13 ENCOUNTER — Ambulatory Visit (INDEPENDENT_AMBULATORY_CARE_PROVIDER_SITE_OTHER): Payer: Medicare Other

## 2021-03-13 ENCOUNTER — Ambulatory Visit: Payer: Medicare Other | Admitting: Podiatry

## 2021-03-13 ENCOUNTER — Other Ambulatory Visit: Payer: Self-pay

## 2021-03-13 ENCOUNTER — Other Ambulatory Visit: Payer: Medicare Other

## 2021-03-13 ENCOUNTER — Other Ambulatory Visit (INDEPENDENT_AMBULATORY_CARE_PROVIDER_SITE_OTHER): Payer: Medicare Other

## 2021-03-13 DIAGNOSIS — M21612 Bunion of left foot: Secondary | ICD-10-CM

## 2021-03-13 DIAGNOSIS — M21619 Bunion of unspecified foot: Secondary | ICD-10-CM

## 2021-03-13 DIAGNOSIS — E785 Hyperlipidemia, unspecified: Secondary | ICD-10-CM | POA: Diagnosis not present

## 2021-03-13 DIAGNOSIS — Z125 Encounter for screening for malignant neoplasm of prostate: Secondary | ICD-10-CM | POA: Diagnosis not present

## 2021-03-13 DIAGNOSIS — Z1159 Encounter for screening for other viral diseases: Secondary | ICD-10-CM

## 2021-03-13 DIAGNOSIS — L84 Corns and callosities: Secondary | ICD-10-CM | POA: Diagnosis not present

## 2021-03-13 DIAGNOSIS — M779 Enthesopathy, unspecified: Secondary | ICD-10-CM | POA: Diagnosis not present

## 2021-03-13 LAB — COMPREHENSIVE METABOLIC PANEL
ALT: 28 U/L (ref 0–53)
AST: 20 U/L (ref 0–37)
Albumin: 4.6 g/dL (ref 3.5–5.2)
Alkaline Phosphatase: 55 U/L (ref 39–117)
BUN: 17 mg/dL (ref 6–23)
CO2: 29 mEq/L (ref 19–32)
Calcium: 9.9 mg/dL (ref 8.4–10.5)
Chloride: 102 mEq/L (ref 96–112)
Creatinine, Ser: 0.92 mg/dL (ref 0.40–1.50)
GFR: 86.26 mL/min (ref >60.00)
Glucose, Bld: 108 mg/dL — ABNORMAL HIGH (ref 70–99)
Potassium: 4.5 mEq/L (ref 3.5–5.1)
Sodium: 141 mEq/L (ref 135–145)
Total Bilirubin: 0.4 mg/dL (ref 0.2–1.2)
Total Protein: 7.5 g/dL (ref 6.0–8.3)

## 2021-03-13 LAB — LIPID PANEL
Cholesterol: 211 mg/dL — ABNORMAL HIGH (ref 0–200)
HDL: 40.5 mg/dL (ref >39.00)
LDL Cholesterol: 136 mg/dL — ABNORMAL HIGH (ref 0–99)
NonHDL: 170.32
Total CHOL/HDL Ratio: 5
Triglycerides: 172 mg/dL — ABNORMAL HIGH (ref 0.0–149.0)
VLDL: 34.4 mg/dL (ref 0.0–40.0)

## 2021-03-13 LAB — PSA: PSA: 0.16 ng/mL (ref 0.10–4.00)

## 2021-03-13 MED ORDER — TRIAMCINOLONE ACETONIDE 10 MG/ML IJ SUSP
10.0000 mg | Freq: Once | INTRAMUSCULAR | Status: AC
  Administered 2021-03-13: 10 mg

## 2021-03-14 LAB — HEPATITIS C ANTIBODY
Hepatitis C Ab: NONREACTIVE
SIGNAL TO CUT-OFF: 0.01 (ref <1.00)

## 2021-03-15 NOTE — Progress Notes (Signed)
Subjective:   Patient ID: Brian Villanueva, male   DOB: 65 y.o.   MRN: [id number]   HPI Patient is developed inflammation fluid around the fifth metatarsal head left and also has several lesions which have become symptomatic again and make it hard for him to walk or wear shoe gear comfortably   ROS      Objective:  Physical Exam  Neurovascular status intact with patient found to have inflammation fluid buildup around the fifth MPJ left painful when pressed and has several lesions that show waxy course and are painful to walk on     Assessment:  Inflammatory capsulitis fifth MPJ left with pain along with porokeratotic lesions x3 left     Plan:  H&P reviewed condition and did sterile prep and injected the fifth MPJ 3 mg Dexasone Kenalog 5 mg Xylocaine and debrided lesions x3 no iatrogenic bleeding reappoint routine care may eventually require head resection

## 2021-03-16 ENCOUNTER — Ambulatory Visit: Payer: Medicare Other | Admitting: Family Medicine

## 2021-03-17 ENCOUNTER — Telehealth (INDEPENDENT_AMBULATORY_CARE_PROVIDER_SITE_OTHER): Payer: Medicare Other | Admitting: Family Medicine

## 2021-03-17 ENCOUNTER — Other Ambulatory Visit: Payer: Self-pay

## 2021-03-17 ENCOUNTER — Encounter: Payer: Self-pay | Admitting: Family Medicine

## 2021-03-17 DIAGNOSIS — U071 COVID-19: Secondary | ICD-10-CM | POA: Diagnosis not present

## 2021-03-17 HISTORY — DX: COVID-19: U07.1

## 2021-03-17 MED ORDER — NIRMATRELVIR/RITONAVIR (PAXLOVID)TABLET: 3.0000 | ORAL_TABLET | Freq: Two times a day (BID) | ORAL | 0 refills | Status: AC

## 2021-03-17 NOTE — Progress Notes (Signed)
Patient ID: Brian Villanueva, male    DOB: [birth year], 65 y.o.   MRN: [id number]  Virtual visit completed through [place], a video enabled telemedicine application. Due to national recommendations of social distancing due to COVID-19, a virtual visit is felt to be most appropriate for this patient at this time. Reviewed limitations, risks, security and privacy concerns of performing a virtual visit and the availability of in person appointments. I also reviewed that there may be a patient responsible charge related to this service. The patient agreed to proceed.   Patient location: home Provider location: [place] [facility], office Persons participating in this virtual visit: patient, provider   If any vitals were documented, they were collected by patient at home unless specified below.    BP 131/72   Pulse 80   Temp 98 F (36.7 C)   Ht 6' 1.75" (1.873 m)   Wt 285 lb (129.3 kg)   BMI 36.84 kg/m    CC: CPE -> converted to acute care visit as he tested + COVID this morning Subjective:   HPI: Brian Villanueva is a 65 y.o. male presenting on 03/17/2021 for Annual Exam (Prt 2.  Pt tested COVID+ on home test this AM.  C/o body aches, nasal congestion and HA.  Sxs started 03/16/21.  Went to help mom on 03/14/21, then mom tested COVID+ at her doctor on 03/15/21. )   First day of symptoms: 03/17/2021  Tested COVID positive: this morning 03/17/2021. Wife without symptoms, she has tested negative.  Current symptoms: body aches, headache, mild cough, nasal congestion.  No: dyspnea, fevers/chills, abd pain, nausea, diarrhea, ST, hoarse voice.   He recently cared for 67yo MIL - who subsequently tested positive.  Treatments to date: took some tylenol this morning  Risk factors include: obesity, age  COVID vaccination status: immunized with [place] x3, booster 10/2020    Saw health advisor last week for medicare wellness visit. Note reviewed.    Preventative: Colonoscopy - 2 HP, int hem, rpt 10 yrs  (Nandigam) Prostate cancer screening - asxs. Screening with yearly PSA Lung cancer screening - not eligible  Flu shot - yearly [place] 12/2019, 01/2020, booster 10/2020 Tetanus - unsure Pneumovax - 02/2020 Shingrix -  needs to check with pharmacy.  Advanced directive discussion - does not have. Packet provided. Would want wife or son to be HCPOA.  Seat belt use discussed Sunscreen use discussed. No changing moles on skin.  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 Interim medical history since our last visit reviewed. Allergies and medications reviewed and updated. Outpatient Medications Prior to Visit  Medication Sig Dispense Refill  . Cholecalciferol (VITAMIN D3) 50 MCG (2000 UT) TABS Take 1 tablet by mouth daily.    . Multiple Vitamins-Minerals (CENTRUM SILVER 50+MEN PO) Take by mouth daily.    . vitamin C (VITAMIN C) 500 MG tablet Take 2 tablets (1,000 mg total) by mouth daily.    . Zinc 22.5 MG TABS Take 1 tablet by mouth daily.    Marland Kitchen GLUCOSAMINE-CHONDROITIN PO Take 2 tablets  by mouth daily. 1200 mg    . omeprazole (PRILOSEC) 20 MG capsule Take 1 capsule (20 mg total) by mouth daily as needed (heartburn).     No facility-administered medications prior to visit.     Per HPI unless specifically indicated in ROS section below Review of Systems Objective:  BP 131/72   Pulse 80   Temp 98 F (36.7 C)   Ht 6' 1.75" (1.873 m)   Wt 285 lb (129.3 kg)   BMI 36.84 kg/m   Wt Readings from Last 3 Encounters:  03/17/21 285 lb (129.3 kg)  03/17/20  291 lb (132 kg)  03/10/20 295 lb 1 oz (133.8 kg)       Physical exam: Gen: alert, NAD, not ill appearing, sounds congested Pulm: speaks in complete sentences without increased work of breathing Psych: normal mood, normal thought content      Results for orders placed or performed in visit on 03/13/21  Hepatitis C antibody  Result Value Ref Range   Hepatitis C Ab NON-REACTIVE NON-REACTIVE   SIGNAL TO CUT-OFF 0.01 <1.00  PSA  Result Value Ref Range   PSA 0.16 0.10 - 4.00 ng/mL  Comprehensive metabolic panel  Result Value Ref Range   Sodium 141 135 - 145 mEq/L   Potassium 4.5 3.5 - 5.1 mEq/L   Chloride 102 96 - 112 mEq/L   CO2 29 19 - 32 mEq/L   Glucose, Bld 108 (H) 70 - 99 mg/dL   BUN 17 6 - 23 mg/dL   Creatinine, Ser 0.92 0.40 - 1.50 mg/dL   Total Bilirubin 0.4 0.2 - 1.2 mg/dL   Alkaline Phosphatase 55 39 - 117 U/L   AST 20 0 - 37 U/L   ALT 28 0 - 53 U/L   Total Protein 7.5 6.0 - 8.3 g/dL   Albumin 4.6 3.5 - 5.2 g/dL   GFR 86.26 >60.00 mL/min   Calcium 9.9 8.4 - 10.5 mg/dL  Lipid panel  Result Value Ref Range   Cholesterol 211 (H) 0 - 200 mg/dL   Triglycerides 172.0 (H) 0.0 - 149.0 mg/dL   HDL 40.50 >39.00 mg/dL   VLDL 34.4 0.0 - 40.0 mg/dL   LDL Cholesterol 136 (H) 0 - 99 mg/dL   Total CHOL/HDL Ratio 5    NonHDL 170.32    Assessment & Plan:   Problem List Items Addressed This Visit    COVID-19 virus infection    He would benefit from antiviral COVID treatment given age and weight. Recommend full dose Paxlovid given normal kidney function - sent to pharmacy. Reviewed common side effects of Paxlovid including metallic/altered taste, nausea, diarrhea. Discussed this medication is approved under EUA.  Red flags to seek urgent care reviewed.  Update if not improving with treatment.  Pt agrees with plan.       Relevant Medications   nirmatrelvir/ritonavir EUA (PAXLOVID) TABS       Meds ordered this encounter  Medications  . nirmatrelvir/ritonavir EUA  (PAXLOVID) TABS    Sig: Take 3 tablets by mouth 2 (two) times daily for 5 days. Patient GFR is 86. Take nirmatrelvir (150 mg) 2 tablet(s) twice daily for 5 days and ritonavir (100 mg) one tablet twice daily for 5 days.    Dispense:  30 tablet    Refill:  0   No orders of the defined types were placed in this encounter.   I discussed the assessment and treatment plan with the patient. The patient was provided an opportunity to ask questions  and all were answered. The patient agreed with the plan and demonstrated an understanding of the instructions. The patient was advised to call back or seek an in-person evaluation if the symptoms worsen or if the condition fails to improve as anticipated.  Follow up plan: No follow-ups on file.  Ria Bush, MD

## 2021-03-17 NOTE — Assessment & Plan Note (Addendum)
He would benefit from antiviral COVID treatment given age and weight. Recommend full dose Paxlovid given normal kidney function - sent to pharmacy. Reviewed common side effects of Paxlovid including metallic/altered taste, nausea, diarrhea. Discussed this medication is approved under EUA.  Red flags to seek urgent care reviewed.  Update if not improving with treatment.  Pt agrees with plan.

## 2021-04-05 DIAGNOSIS — H2513 Age-related nuclear cataract, bilateral: Secondary | ICD-10-CM | POA: Diagnosis not present

## 2021-05-23 ENCOUNTER — Other Ambulatory Visit: Payer: Self-pay

## 2021-05-23 ENCOUNTER — Encounter: Payer: Self-pay | Admitting: Family Medicine

## 2021-05-23 ENCOUNTER — Ambulatory Visit (INDEPENDENT_AMBULATORY_CARE_PROVIDER_SITE_OTHER): Payer: Medicare Other | Admitting: Family Medicine

## 2021-05-23 VITALS — BP 108/74 | HR 70 | Temp 98.5°F | Ht 74.0 in | Wt 290.0 lb

## 2021-05-23 DIAGNOSIS — R739 Hyperglycemia, unspecified: Secondary | ICD-10-CM | POA: Insufficient documentation

## 2021-05-23 DIAGNOSIS — E785 Hyperlipidemia, unspecified: Secondary | ICD-10-CM | POA: Diagnosis not present

## 2021-05-23 DIAGNOSIS — Z0000 Encounter for general adult medical examination without abnormal findings: Secondary | ICD-10-CM

## 2021-05-23 DIAGNOSIS — E1169 Type 2 diabetes mellitus with other specified complication: Secondary | ICD-10-CM | POA: Insufficient documentation

## 2021-05-23 DIAGNOSIS — Z23 Encounter for immunization: Secondary | ICD-10-CM | POA: Diagnosis not present

## 2021-05-23 DIAGNOSIS — Z7189 Other specified counseling: Secondary | ICD-10-CM

## 2021-05-23 DIAGNOSIS — E669 Obesity, unspecified: Secondary | ICD-10-CM

## 2021-05-23 DIAGNOSIS — R7303 Prediabetes: Secondary | ICD-10-CM | POA: Insufficient documentation

## 2021-05-23 NOTE — Patient Instructions (Addendum)
Cholesterol levels were elevated - work on healthy low cholesterol diet (handout provided today). Work on Scientist, physiological.  Good to see you today Return as needed or in 1 year for next physical/wellness visit   Health Maintenance After Age 67 After age 45, you are at a higher risk for certain long-term diseases and infections as well as injuries from falls. Falls are a major cause of broken bones and head injuries in people who are older than age 49. Getting regular preventive care can help to keep you healthy and well. Preventive care includes getting regular testing and making lifestyle changes as recommended by your health care provider. Talk with your health care provider about: Which screenings and tests you should have. A screening is a test that checks for a disease when you have no symptoms. A diet and exercise plan that is right for you. What should I know about screenings and tests to prevent falls? Screening and testing are the best ways to find a health problem early. Early diagnosis and treatment give you the best chance of managing medical conditions that are common after age 49. Certain conditions and lifestyle choices may make you more likely to have a fall. Your health care provider may recommend: Regular vision checks. Poor vision and conditions such as cataracts can make you more likely to have a fall. If you wear glasses, make sure to get your prescription updated if your vision changes. Medicine review. Work with your health care provider to regularly review all of the medicines you are taking, including over-the-counter medicines. Ask your health care provider about any side effects that may make you more likely to have a fall. Tell your health care provider if any medicines that you take make you feel dizzy or sleepy. Osteoporosis screening. Osteoporosis is a condition that causes the bones to get weaker. This can make the bones weak and cause them to break more easily. Blood  pressure screening. Blood pressure changes and medicines to control blood pressure can make you feel dizzy. Strength and balance checks. Your health care provider may recommend certain tests to check your strength and balance while standing, walking, or changing positions. Foot health exam. Foot pain and numbness, as well as not wearing proper footwear, can make you more likely to have a fall. Depression screening. You may be more likely to have a fall if you have a fear of falling, feel emotionally low, or feel unable to do activities that you used to do. Alcohol use screening. Using too much alcohol can affect your balance and may make you more likely to have a fall. What actions can I take to lower my risk of falls? General instructions Talk with your health care provider about your risks for falling. Tell your health care provider if: You fall. Be sure to tell your health care provider about all falls, even ones that seem minor. You feel dizzy, sleepy, or off-balance. Take over-the-counter and prescription medicines only as told by your health care provider. These include any supplements. Eat a healthy diet and maintain a healthy weight. A healthy diet includes low-fat dairy products, low-fat (lean) meats, and fiber from whole grains, beans, and lots of fruits and vegetables. Home safety Remove any tripping hazards, such as rugs, cords, and clutter. Install safety equipment such as grab bars in bathrooms and safety rails on stairs. Keep rooms and walkways well-lit. Activity  Follow a regular exercise program to stay fit. This will help you maintain your balance. Ask your  health care provider what types of exercise are appropriate for you. If you need a cane or walker, use it as recommended by your health care provider. Wear supportive shoes that have nonskid soles.  Lifestyle Do not drink alcohol if your health care provider tells you not to drink. If you drink alcohol, limit how much you  have: 0-1 drink a day for women. 0-2 drinks a day for men. Be aware of how much alcohol is in your drink. In the [place] one drink equals one typical bottle of beer (12 oz), one-half glass of wine (5 oz), or one shot of hard liquor (1 oz). Do not use any products that contain nicotine or tobacco, such as cigarettes and e-cigarettes. If you need help quitting, ask your health care provider. Summary Having a healthy lifestyle and getting preventive care can help to protect your health and wellness after age 70. Screening and testing are the best way to find a health problem early and help you avoid having a fall. Early diagnosis and treatment give you the best chance for managing medical conditions that are more common for people who are older than age 28. Falls are a major cause of broken bones and head injuries in people who are older than age 102. Take precautions to prevent a fall at home. Work with your health care provider to learn what changes you can make to improve your health and wellness and to prevent falls. This information is not intended to replace advice given to you by your health care provider. Make sure you discuss any questions you have with your healthcare provider. Document Revised: 10/14/2020 Document Reviewed: 10/14/2020 Elsevier Patient Education  2022 [employer].

## 2021-05-23 NOTE — Assessment & Plan Note (Signed)
Encouraged healthy diet and lifestyle choices to affect sustainable weight loss.  ?

## 2021-05-23 NOTE — Assessment & Plan Note (Signed)
Discussed elevated sugar readings ?prediabetes. Encouraged limiting added sugars/sweetened beverages.

## 2021-05-23 NOTE — Assessment & Plan Note (Addendum)
Advanced directive discussion - does not have. Packet previously provided. Would want wife or son to be HCPOA.

## 2021-05-23 NOTE — Progress Notes (Signed)
Patient ID: Brian Villanueva, male    DOB: 08-08-1956, 65 y.o.   MRN: [id number]  This visit was conducted in person.  BP 108/74   Pulse 70   Temp 98.5 F (36.9 C) (Temporal)   Ht 6\' 2"  (1.88 m)   Wt 290 lb (131.5 kg)   SpO2 95%   BMI 37.23 kg/m    CC: CPE Subjective:   HPI: Brian Villanueva is a 65 y.o. male presenting on 05/23/2021 for Annual Exam   Saw health advisor last week for medicare wellness visit. Note reviewed.    No results found.  Flowsheet Row Clinical Support from 03/08/2021 in [facility]  PHQ-2 Total Score 0       Fall Risk  03/08/2021 03/03/2020  Falls in the past year? 0 0  Number falls in past yr: 0 0  Injury with Fall? 0 0  Risk for fall due to : No Fall Risks No Fall Risks  Follow up Falls evaluation completed;Falls prevention discussed Falls evaluation completed;Falls prevention discussed    Recent vacation to [facility].  COVID infection 03/2021 treated with paxlovid - symptoms fully resolved.   Preventative: Colonoscopy 03/2020 - HP, rpt 10 yrs (Nandigam)  Prostate cancer screening - asxs. Screen yearly  Lung cancer screening - not eligible Flu shot - yearly Tetanus - unsure COVID vaccine - pfizer 2/201, 01/2020, booster 10/2020. Pneumovax23 - 02/2020. Prevnar 20 today Shingrix - discussed. To check with pharmacy.  Advanced directive discussion - does not have. Packet provided. Would want wife or son to be HCPOA.  Seat belt use discussed Sunscreen use discussed. No changing moles on skin. 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 Interim medical history since our last visit reviewed. Allergies and medications reviewed and updated. Outpatient Medications Prior to Visit  Medication Sig Dispense Refill   Cholecalciferol (VITAMIN D3) 50 MCG (2000 UT) TABS Take 1 tablet by mouth daily.     Multiple Vitamins-Minerals (CENTRUM SILVER 50+MEN PO) Take by mouth daily.     vitamin C (VITAMIN C) 500 MG tablet Take 2 tablets (1,000 mg total) by mouth daily.     Zinc 22.5 MG TABS Take 1 tablet by mouth daily.     No facility-administered medications prior to visit.     Per HPI unless specifically indicated in ROS section below Review of Systems  Constitutional:  Negative for activity change, appetite change, chills, fatigue, fever and unexpected weight change.  HENT:  Negative for hearing loss.   Eyes:  Negative for visual disturbance.  Respiratory:  Negative for cough, chest tightness, shortness of breath and wheezing.   Cardiovascular:  Negative for chest pain, palpitations and leg swelling.  Gastrointestinal:  Negative for abdominal distention, abdominal pain, blood in stool, constipation, diarrhea, nausea and vomiting.  Genitourinary:  Negative for difficulty urinating and hematuria.  Musculoskeletal:  Negative for arthralgias, myalgias and neck pain.  Skin:  Negative for rash.  Neurological:  Negative for dizziness, seizures, syncope and headaches.  Hematological:  Negative for adenopathy. Does not bruise/bleed easily.  Psychiatric/Behavioral:  Negative for dysphoric mood. The patient is not nervous/anxious.        Increased family and work stress   Objective:  BP 108/74   Pulse 70   Temp 98.5 F (36.9 C) (Temporal)   Ht 6\' 2"  (1.88 m)   Wt 290 lb (131.5 kg)   SpO2 95%   BMI 37.23 kg/m   Wt Readings from Last 3 Encounters:  05/23/21 290 lb (131.5 kg)  03/17/21 285 lb (129.3 kg)  03/17/20 291 lb (132 kg)      Physical  Exam Vitals and nursing note reviewed.  Constitutional:      General: He is not in acute distress.    Appearance: Normal appearance. He is well-developed. He is not ill-appearing.  HENT:     Head: Normocephalic and atraumatic.     Right Ear: Hearing, tympanic membrane, ear canal and external ear normal.     Left Ear: Hearing, tympanic membrane, ear canal and external ear normal.  Eyes:     General: No scleral icterus.    Extraocular Movements: Extraocular movements intact.     Conjunctiva/sclera: Conjunctivae normal.     Pupils: Pupils are equal, round, and reactive to light.  Neck:     Thyroid: No thyroid mass or thyromegaly.  Cardiovascular:     Rate and Rhythm: Normal rate and regular rhythm.     Pulses: Normal pulses.          Radial pulses are 2+ on the right side and 2+ on the left side.     Heart sounds: Normal heart sounds. No murmur heard. Pulmonary:     Effort: Pulmonary effort is normal. No respiratory distress.     Breath sounds: Normal breath sounds. No wheezing, rhonchi or rales.  Abdominal:     General: Bowel sounds are normal. There is no distension.     Palpations: Abdomen is soft. There is no mass.     Tenderness: no abdominal tenderness There is no guarding or rebound.     Hernia: No hernia is present.  Musculoskeletal:        General: Normal range of motion.     Cervical back: Normal range of motion and neck supple.     Right lower leg: No edema.     Left lower leg: No edema.  Lymphadenopathy:     Cervical: No cervical adenopathy.  Skin:    General: Skin is warm and dry.     Findings: No rash.  Neurological:     General: No focal deficit present.     Mental Status: He is alert and oriented to person, place, and time.  Psychiatric:        Mood and Affect: Mood normal.        Behavior: Behavior normal.        Thought Content: Thought content normal.        Judgment: Judgment normal.      Results for orders placed or performed in visit on 03/13/21   Hepatitis C antibody  Result Value Ref Range   Hepatitis C Ab NON-REACTIVE NON-REACTIVE   SIGNAL TO CUT-OFF 0.01 <1.00  PSA  Result Value Ref Range   PSA 0.16 0.10 - 4.00 ng/mL  Comprehensive metabolic panel  Result Value Ref Range   Sodium 141 135 - 145 mEq/L   Potassium 4.5 3.5 - 5.1 mEq/L   Chloride 102 96 - 112 mEq/L   CO2 29 19 -  32 mEq/L   Glucose, Bld 108 (H) 70 - 99 mg/dL   BUN 17 6 - 23 mg/dL   Creatinine, Ser 0.92 0.40 - 1.50 mg/dL   Total Bilirubin 0.4 0.2 - 1.2 mg/dL   Alkaline Phosphatase 55 39 - 117 U/L   AST 20 0 - 37 U/L   ALT 28 0 - 53 U/L   Total Protein 7.5 6.0 - 8.3 g/dL   Albumin 4.6 3.5 - 5.2 g/dL   GFR 86.26 >60.00 mL/min   Calcium 9.9 8.4 - 10.5 mg/dL  Lipid panel  Result Value Ref Range   Cholesterol 211 (H) 0 - 200 mg/dL   Triglycerides 172.0 (H) 0.0 - 149.0 mg/dL   HDL 40.50 >39.00 mg/dL   VLDL 34.4 0.0 - 40.0 mg/dL   LDL Cholesterol 136 (H) 0 - 99 mg/dL   Total CHOL/HDL Ratio 5    NonHDL 170.32     Assessment & Plan:  This visit occurred during the SARS-CoV-2 public health emergency.  Safety protocols were in place, including screening questions prior to the visit, additional usage of staff PPE, and extensive cleaning of exam room while observing appropriate contact time as indicated for disinfecting solutions.   Problem List Items Addressed This Visit     Obesity, Class II, BMI 35-39.9, no comorbidity    Encouraged healthy diet and lifestyle choices to affect sustainable weight loss.        Health maintenance examination - Primary    Preventative protocols reviewed and updated unless pt declined. Discussed healthy diet and lifestyle.        Advanced directives, counseling/discussion    Advanced directive discussion - does not have. Packet previously provided. Would want wife or son to be HCPOA.        Dyslipidemia    Discussed deteriorated readings. Reviewed ascvd risk and relation to smoking (cigars). Reviewed diet choices to  improve levels. He will work towards low chol diet - handout provided.  The 10-year ASCVD risk score Mikey Bussing DC Brooke Bonito., et al., 2013) is: 16.7%   Values used to calculate the score:     Age: 76 years     Sex: Male     Is Non-Hispanic African American: No     Diabetic: No     Tobacco smoker: Yes     Systolic Blood Pressure: 850 mmHg     Is BP treated: No     HDL Cholesterol: 40.5 mg/dL     Total Cholesterol: 211 mg/dL        Hyperglycemia    Discussed elevated sugar readings ?prediabetes. Encouraged limiting added sugars/sweetened beverages.        Other Visit Diagnoses     Need for vaccination against Streptococcus pneumoniae       Relevant Orders   Pneumococcal conjugate vaccine 20-valent (Prevnar 20) (Completed)        No orders of the defined types were placed in this encounter.  Orders Placed This Encounter  Procedures   Pneumococcal conjugate vaccine 20-valent (Prevnar 20)    Patient instructions: Cholesterol levels were elevated - work on healthy low cholesterol diet (handout provided today). Work on Scientist, physiological.  Good to see you today Return as needed or in 1 year for next physical/wellness visit   Follow up plan: Return in about 1 year (around 05/23/2022), or if symptoms worsen or fail to improve, for annual exam, prior fasting for blood work.  Ria Bush, MD

## 2021-05-23 NOTE — Assessment & Plan Note (Addendum)
Discussed deteriorated readings. Reviewed ascvd risk and relation to smoking (cigars). Reviewed diet choices to improve levels. He will work towards low chol diet - handout provided.  The 10-year ASCVD risk score Mikey Bussing DC Brooke Bonito., et al., 2013) is: 16.7%   Values used to calculate the score:     Age: 65 years     Sex: Male     Is Non-Hispanic African American: No     Diabetic: No     Tobacco smoker: Yes     Systolic Blood Pressure: 309 mmHg     Is BP treated: No     HDL Cholesterol: 40.5 mg/dL     Total Cholesterol: 211 mg/dL

## 2021-05-23 NOTE — Assessment & Plan Note (Signed)
Preventative protocols reviewed and updated unless pt declined. Discussed healthy diet and lifestyle.  

## 2021-11-17 ENCOUNTER — Ambulatory Visit (HOSPITAL_BASED_OUTPATIENT_CLINIC_OR_DEPARTMENT_OTHER)
Admission: RE | Admit: 2021-11-17 | Discharge: 2021-11-17 | Disposition: A | Discharge status: Home / Self Care | Payer: Medicare Other | Source: Ambulatory Visit | Attending: Family Medicine | Admitting: Family Medicine

## 2021-11-17 ENCOUNTER — Ambulatory Visit (INDEPENDENT_AMBULATORY_CARE_PROVIDER_SITE_OTHER): Payer: Medicare Other | Admitting: Family Medicine

## 2021-11-17 ENCOUNTER — Encounter: Payer: Self-pay | Admitting: Family Medicine

## 2021-11-17 ENCOUNTER — Telehealth: Payer: Self-pay | Admitting: Family Medicine

## 2021-11-17 ENCOUNTER — Other Ambulatory Visit: Payer: Self-pay

## 2021-11-17 VITALS — BP 120/70 | HR 67 | Temp 98.0°F | Ht 74.0 in | Wt 290.5 lb

## 2021-11-17 DIAGNOSIS — R1084 Generalized abdominal pain: Secondary | ICD-10-CM

## 2021-11-17 DIAGNOSIS — R1011 Right upper quadrant pain: Secondary | ICD-10-CM | POA: Insufficient documentation

## 2021-11-17 DIAGNOSIS — R3129 Other microscopic hematuria: Secondary | ICD-10-CM

## 2021-11-17 DIAGNOSIS — K760 Fatty (change of) liver, not elsewhere classified: Secondary | ICD-10-CM | POA: Diagnosis not present

## 2021-11-17 LAB — CBC WITH DIFFERENTIAL/PLATELET
Basophils Absolute: 0 10*3/uL (ref 0.0–0.1)
Basophils Relative: 0.7 % (ref 0.0–3.0)
Eosinophils Absolute: 0.3 10*3/uL (ref 0.0–0.7)
Eosinophils Relative: 4.1 % (ref 0.0–5.0)
HCT: 46.4 % (ref 39.0–52.0)
Hemoglobin: 15.4 g/dL (ref 13.0–17.0)
Lymphocytes Relative: 23.9 % (ref 12.0–46.0)
Lymphs Abs: 1.6 10*3/uL (ref 0.7–4.0)
MCHC: 33.3 g/dL (ref 30.0–36.0)
MCV: 93.5 fl (ref 78.0–100.0)
Monocytes Absolute: 0.3 10*3/uL (ref 0.1–1.0)
Monocytes Relative: 4.8 % (ref 3.0–12.0)
Neutro Abs: 4.3 10*3/uL (ref 1.4–7.7)
Neutrophils Relative %: 66.5 % (ref 43.0–77.0)
Platelets: 170 10*3/uL (ref 150.0–400.0)
RBC: 4.96 Mil/uL (ref 4.22–5.81)
RDW: 14 % (ref 11.5–15.5)
WBC: 6.5 10*3/uL (ref 4.0–10.5)

## 2021-11-17 LAB — COMPREHENSIVE METABOLIC PANEL
ALT: 38 U/L (ref 0–53)
AST: 28 U/L (ref 0–37)
Albumin: 4.6 g/dL (ref 3.5–5.2)
Alkaline Phosphatase: 53 U/L (ref 39–117)
BUN: 21 mg/dL (ref 6–23)
CO2: 29 mEq/L (ref 19–32)
Calcium: 9.3 mg/dL (ref 8.4–10.5)
Chloride: 102 mEq/L (ref 96–112)
Creatinine, Ser: 0.87 mg/dL (ref 0.40–1.50)
GFR: 89.05 mL/min (ref >60.00)
Glucose, Bld: 93 mg/dL (ref 70–99)
Potassium: 4.2 mEq/L (ref 3.5–5.1)
Sodium: 138 mEq/L (ref 135–145)
Total Bilirubin: 0.6 mg/dL (ref 0.2–1.2)
Total Protein: 7.7 g/dL (ref 6.0–8.3)

## 2021-11-17 LAB — POC URINALSYSI DIPSTICK (AUTOMATED)
Bilirubin, UA: NEGATIVE
Glucose, UA: NEGATIVE
Ketones, UA: NEGATIVE
Leukocytes, UA: NEGATIVE
Nitrite, UA: NEGATIVE
Protein, UA: POSITIVE — AB
Spec Grav, UA: 1.02 (ref 1.010–1.025)
Urobilinogen, UA: 0.2 E.U./dL
pH, UA: 6 (ref 5.0–8.0)

## 2021-11-17 LAB — URINALYSIS, MICROSCOPIC ONLY

## 2021-11-17 LAB — LIPASE: Lipase: 24 U/L (ref 11.0–59.0)

## 2021-11-17 MED ORDER — OMEPRAZOLE 40 MG PO CPDR: 40.0000 mg | DELAYED_RELEASE_CAPSULE | Freq: Every day | ORAL | 1 refills | Status: DC

## 2021-11-17 NOTE — Patient Instructions (Addendum)
Labs today, urinalysis today.  We will order abdominal ultrasound.  Suspicious for gallbladder pain.  Avoid fried greasy foods for now.   Biliary Colic, Adult Biliary colic is severe pain caused by a problem with the gallbladder. The gallbladder is a small organ in the upper right part of the abdomen. The gallbladder stores a digestive fluid produced in the liver (bile) that helps the body break down fat. Bile and other digestive enzymes are carried from the liver to the small intestine through tube-like structures called bile ducts. The gallbladder and the bile ducts form the biliary tract. Sometimes, hard deposits of digestive fluids (gallstones) form in the gallbladder and block the flow of bile from the gallbladder, causing biliary colic. This condition is also called a gallbladder attack. Gallstones can be as small as a grain of sand or as big as a golf ball. There could be just one gallstone in the gallbladder, or there could be many. What are the causes? This condition is usually caused by gallstones. Less often, a tumor could block the flow of bile from the gallbladder and trigger biliary colic. What increases the risk? The following factors may make you more likely to develop this condition: Being male. Having a family history of gallstones. Being obese. Losing weight suddenly or quickly. Eating a diet that is high in calories, low in fiber, and rich in refined carbohydrates, such as white bread and white rice. Having certain health conditions, such as: An intestinal disease that affects nutrient absorption, such as Crohn's disease. A metabolic condition, such as diabetes or metabolic syndrome. Metabolic syndrome occurs when someone has high blood pressure, high cholesterol, and diabetes. A blood condition, such as hemolytic anemia or sickle cell disease. What are the signs or symptoms? The main symptom of this condition is severe pain in the upper right side of the abdomen. You may  feel this pain below the chest but above the hip. This pain often occurs at night or after eating a meal that is high in fat. This pain may get worse for up to an hour and last as long as 12 hours. In most cases, the pain fades (subsides) within 2 hours. Other symptoms of this condition include: Nausea and vomiting. Pain under the right shoulder. How is this diagnosed? This condition is diagnosed based on your medical history, your symptoms, and a physical exam. You may also have tests, including: Blood tests to rule out infection or inflammation of the bile ducts, gallbladder, pancreas, or liver. Imaging studies, such as: An ultrasound. A CT scan. An MRI. In some cases, you may need to have an imaging study done using a small amount of radioactive material (nuclear medicine) to confirm the diagnosis. How is this treated? This condition may be treated with medicines to: Relieve your pain or nausea. Dissolve the gallstones. It may take months or years before the gallstones are completely gone. If you have gallstones, or if you have a tumor in the gallbladder that is causing biliary colic, you may need surgery to remove the gallbladder (cholecystectomy). Follow these instructions at home: Eating and drinking Drink enough fluid to keep your urine pale yellow. Follow instructions from your health care provider about eating or drinking restrictions. These may include avoiding: Fatty, greasy, and fried foods. Any foods that make the pain worse. Overeating. Having a large meal after not eating for a while. General instructions Take over-the-counter and prescription medicines only as told by your health care provider. Keep all follow-up visits as  told by your health care provider. This is important. How is this prevented? Steps to prevent this condition include: Maintaining a healthy body weight. Getting regular exercise. Eating a healthy diet that is high in fiber and low in fat. Limiting  how much sugar and refined carbohydrates you eat. Contact a health care provider if: Your pain lasts more than 5 hours. You vomit. You have a fever and chills. Your pain gets worse. Get help right away if: Your skin or the whites of your eyes look yellow (jaundice). Your have tea-colored urine and light-colored stools (feces). You are dizzy or you faint. Summary Biliary colic is severe pain caused by a problem with the gallbladder. The gallbladder is a small organ in the upper right part of your abdomen. Treatment for this condition may include medicine to relieve your pain or nausea, or medicine to slowly dissolve the gallstones. If you have gallstones, or if you have a tumor in the gallbladder that is causing biliary colic, you may need surgery to remove the gallbladder (cholecystectomy). This information is not intended to replace advice given to you by your health care provider. Make sure you discuss any questions you have with your health care provider. Document Revised: 11/10/2019 Document Reviewed: 09/01/2019 Elsevier Patient Education  [place].

## 2021-11-17 NOTE — Telephone Encounter (Signed)
Brian Villanueva with [facility] called stating that they cant get pt in until 11/22/21 at 10:30. Brian Villanueva wanted  to know if you wanted to go with someone sooner or to keep the appt with them. Please advise.

## 2021-11-17 NOTE — Assessment & Plan Note (Signed)
Story/exam suspicious for cholelithiasis. Reviewed pathophysiology of gallstones and biliary colic. Check UA, labs, abd [place] and if gallstones present, will refer to gen surgery. Pt agrees with plan.

## 2021-11-17 NOTE — Progress Notes (Signed)
Patient ID: Brian Villanueva, male    DOB: 04-19-1956, 66 y.o.   MRN: [id number]  This visit was conducted in person.  BP 120/70    Pulse 67    Temp 98 F (36.7 C) (Temporal)    Ht 6\' 2"  (1.88 m)    Wt 290 lb 8 oz (131.8 kg)    SpO2 94%    BMI 37.30 kg/m    CC: RUQ pain  Subjective:   HPI: Brian Villanueva is a 66 y.o. male presenting on 11/17/2021 for URQ Pain (Radiates around to back)   Months of RUQ abdominal discomfort with radiation to below shoulder blade. Acutely worse over weekend - ate a lot of junk food including chocolate and beer. Pain described as sharp stabbing twisting pain. Ongoing almost constant pain. Some associated diaphoresis, malaise, some diarrhea over weekend. + indigestion gassiness and bloating. Does describe some crescendo-decrescendo pressure RUQ pain.   This week he started eating bland foods and watching portion sizes - which has helped some.  No fevers/chills, nausea/vomiting, bowel changes or constipation, blood in stool. No lower abd pain or urinary symptoms. No dysphagia, unexpected weight changes or early satiety. No chest pain, tightness, dyspnea or palpitations.   Known h/o GERD but not on medication for this.   H/o kidney stones - this feels different. COLONOSCOPY 03/2020 - [facility], rpt 10 yrs (Nandigam)     Relevant past medical, surgical, family and social history reviewed and updated as indicated. Interim medical history since our last visit reviewed. Allergies and medications reviewed and updated. Outpatient Medications Prior to Visit  Medication Sig Dispense Refill   Cholecalciferol (VITAMIN D3) 50 MCG (2000 UT) TABS Take 1 tablet by mouth daily.     Multiple Vitamins-Minerals (CENTRUM SILVER 50+MEN PO) Take by mouth daily.     vitamin C (VITAMIN C) 500 MG tablet Take 2 tablets (1,000 mg total) by mouth daily.     Zinc 22.5 MG TABS Take 1 tablet by mouth daily.     No facility-administered medications prior to visit.     Per HPI unless  specifically indicated in ROS section below Review of Systems  Objective:  BP 120/70    Pulse 67    Temp 98 F (36.7 C) (Temporal)    Ht 6\' 2"  (1.88 m)    Wt 290 lb 8 oz (131.8 kg)    SpO2 94%    BMI 37.30 kg/m   Wt Readings from Last 3 Encounters:  11/17/21 290 lb 8 oz (131.8 kg)  05/23/21 290 lb (131.5 kg)  03/17/21 285 lb (129.3 kg)      Physical Exam Vitals and nursing note reviewed.  Constitutional:      Appearance: Normal appearance. He is obese. He is not ill-appearing.  Eyes:     Extraocular Movements: Extraocular movements intact.     Pupils: Pupils are equal, round, and reactive to light.  Cardiovascular:     Rate and Rhythm: Normal rate and regular rhythm.     Pulses: Normal pulses.     Heart sounds: Normal heart sounds. No murmur heard. Pulmonary:     Effort: Pulmonary effort is normal. No respiratory distress.     Breath sounds: Normal breath sounds. No wheezing, rhonchi or rales.  Abdominal:     General: Bowel sounds are normal. There is no distension.     Palpations: Abdomen is soft. There is no mass.     Tenderness: There is abdominal tenderness (mild-mod) in the right  upper quadrant. There is right CVA tenderness (mild). There is no left CVA tenderness, guarding or rebound. Positive signs include Murphy's sign.     Hernia: No hernia is present.  Musculoskeletal:     Right lower leg: No edema.     Left lower leg: No edema.  Skin:    General: Skin is warm and dry.     Findings: No rash.  Neurological:     Mental Status: He is alert.  Psychiatric:        Mood and Affect: Mood normal.        Behavior: Behavior normal.      Results for orders placed or performed in visit on 11/17/21  POCT Urinalysis Dipstick (Automated)  Result Value Ref Range   Color, UA Yellow    Clarity, UA Clear    Glucose, UA Negative Negative   Bilirubin, UA Negative    Ketones, UA Negative    Spec Grav, UA 1.020 1.010 - 1.025   Blood, UA Moderate (2+)    pH, UA 6.0 5.0 - 8.0    Protein, UA Positive (A) Negative   Urobilinogen, UA 0.2 0.2 or 1.0 E.U./dL   Nitrite, UA Negative    Leukocytes, UA Negative Negative    Assessment & Plan:  This visit occurred during the SARS-CoV-2 public health emergency.  Safety protocols were in place, including screening questions prior to the visit, additional usage of staff PPE, and extensive cleaning of exam room while observing appropriate contact time as indicated for disinfecting solutions.   Problem List Items Addressed This Visit     RUQ abdominal pain - Primary    Story/exam suspicious for cholelithiasis. Reviewed pathophysiology of gallstones and biliary colic. Check UA, labs, abd [place] and if gallstones present, will refer to gen surgery. Pt agrees with plan.       Relevant Orders   Comprehensive metabolic panel   CBC with Differential/Platelet   Lipase   US Abdomen Complete   POCT Urinalysis Dipstick (Automated) (Completed)   Other Visit Diagnoses     Generalized abdominal pain       Relevant Orders   Lipase   US Abdomen Complete   Microhematuria       Relevant Orders   Urine Microscopic        Meds ordered this encounter  Medications   omeprazole (PRILOSEC) 40 MG capsule    Sig: Take 1 capsule (40 mg total) by mouth daily. Daily for 3 weeks then as needed    Dispense:  30 capsule    Refill:  1   Orders Placed This Encounter  Procedures   US Abdomen Complete    Standing Status:   Future    Standing Expiration Date:   11/17/2022    Order Specific Question:   Reason for Exam (SYMPTOM  OR DIAGNOSIS REQUIRED)    Answer:   abd pain    Order Specific Question:   Preferred imaging location?    Answer:   [facility]   Comprehensive metabolic panel   CBC with Differential/Platelet   Lipase   Urine Microscopic   POCT Urinalysis Dipstick (Automated)     Patient instructions: Labs today, urinalysis today.  We will order abdominal ultrasound.  Suspicious for gallbladder pain.  Avoid fried greasy  foods for now.   Follow up plan: Return if symptoms worsen or fail to improve.  Ria Bush, MD

## 2021-11-17 NOTE — Telephone Encounter (Signed)
This has been addressed.

## 2021-11-20 ENCOUNTER — Encounter: Payer: Self-pay | Admitting: Family Medicine

## 2021-11-20 ENCOUNTER — Other Ambulatory Visit: Payer: Self-pay | Admitting: Family Medicine

## 2021-11-20 DIAGNOSIS — R160 Hepatomegaly, not elsewhere classified: Secondary | ICD-10-CM

## 2021-11-20 DIAGNOSIS — K760 Fatty (change of) liver, not elsewhere classified: Secondary | ICD-10-CM | POA: Insufficient documentation

## 2021-11-20 DIAGNOSIS — I714 Abdominal aortic aneurysm, without rupture, unspecified: Secondary | ICD-10-CM | POA: Insufficient documentation

## 2021-11-20 DIAGNOSIS — R3129 Other microscopic hematuria: Secondary | ICD-10-CM | POA: Insufficient documentation

## 2021-11-20 DIAGNOSIS — K769 Liver disease, unspecified: Secondary | ICD-10-CM

## 2021-11-20 HISTORY — DX: Abdominal aortic aneurysm, without rupture, unspecified: I71.40

## 2021-11-22 ENCOUNTER — Telehealth: Payer: Self-pay | Admitting: Family Medicine

## 2021-11-22 ENCOUNTER — Other Ambulatory Visit: Payer: Medicare Other

## 2021-11-22 NOTE — Telephone Encounter (Signed)
Brian Villanueva called in and wanted to know about the appointment for the MRI due to they have been waiting

## 2021-11-23 NOTE — Telephone Encounter (Signed)
Mrs. Nine called in and wanted to check on the MRI and he is in pain and they are wanting to know what is going on.

## 2021-11-24 NOTE — Telephone Encounter (Signed)
This had to be precerted - No Auth required. This can be scheduled  Left a detailed message that this is ready to be scheduled.    Information of where to call left in the voicemail  They have two locations in [place] and one in [place].  [place], [place]     They can be reached at [phone]

## 2021-11-24 NOTE — Telephone Encounter (Signed)
Noted  

## 2021-12-08 ENCOUNTER — Ambulatory Visit
Admission: RE | Admit: 2021-12-08 | Discharge: 2021-12-08 | Disposition: A | Discharge status: Home / Self Care | Payer: Medicare Other | Source: Ambulatory Visit | Attending: Family Medicine | Admitting: Family Medicine

## 2021-12-08 ENCOUNTER — Other Ambulatory Visit: Payer: Self-pay

## 2021-12-08 DIAGNOSIS — R160 Hepatomegaly, not elsewhere classified: Secondary | ICD-10-CM

## 2021-12-08 DIAGNOSIS — K769 Liver disease, unspecified: Secondary | ICD-10-CM

## 2021-12-08 DIAGNOSIS — N281 Cyst of kidney, acquired: Secondary | ICD-10-CM | POA: Diagnosis not present

## 2021-12-08 MED ORDER — GADOBENATE DIMEGLUMINE 529 MG/ML IV SOLN
20.0000 mL | Freq: Once | INTRAVENOUS | Status: AC | PRN
  Administered 2021-12-08: 20 mL via INTRAVENOUS

## 2021-12-18 ENCOUNTER — Telehealth: Payer: Self-pay | Admitting: Family Medicine

## 2021-12-18 DIAGNOSIS — R1011 Right upper quadrant pain: Secondary | ICD-10-CM

## 2021-12-18 NOTE — Telephone Encounter (Signed)
Mrs. Hedglin called in and stated that Brian Villanueva is still having pain, and the last time they saw Dr. Darnell Level they stated that if he was still having pain then they would need to schedule the scan

## 2021-12-18 NOTE — Addendum Note (Signed)
Addended by: Ria Bush on: 12/18/2021 04:53 PM   Modules accepted: Orders

## 2021-12-18 NOTE — Telephone Encounter (Signed)
Spoke with pt's wife, Karna Christmas (on dpr), relaying Dr. Synthia Innocent message.  Verbalizes understanding and will inform pt.

## 2021-12-18 NOTE — Telephone Encounter (Addendum)
Plz notify pt that gallbladder emptying study was ordered. We will call him for appt at [facility].

## 2022-01-02 ENCOUNTER — Telehealth: Payer: Self-pay | Admitting: Family Medicine

## 2022-01-02 NOTE — Telephone Encounter (Signed)
Pt was notified of appt date and time.  No auth required per [employer]  Apologized for the delay - wife was appreciative and states that they will call if anything worsens between now and his appt.   Nothing further needed.

## 2022-01-02 NOTE — Telephone Encounter (Signed)
Mrs. Shular called in and wanted to know about the status of the scan for her husband due to she hasn't heard anything back yet about an appointment

## 2022-01-09 ENCOUNTER — Ambulatory Visit (HOSPITAL_COMMUNITY)
Admission: RE | Admit: 2022-01-09 | Discharge: 2022-01-09 | Disposition: A | Discharge status: Home / Self Care | Payer: Medicare Other | Source: Ambulatory Visit | Attending: Family Medicine | Admitting: Family Medicine

## 2022-01-09 ENCOUNTER — Other Ambulatory Visit: Payer: Self-pay

## 2022-01-09 DIAGNOSIS — R1011 Right upper quadrant pain: Secondary | ICD-10-CM | POA: Diagnosis not present

## 2022-01-09 MED ORDER — TECHNETIUM TC 99M MEBROFENIN IV KIT
5.2000 | PACK | Freq: Once | INTRAVENOUS | Status: AC | PRN
  Administered 2022-01-09: 5.2 via INTRAVENOUS

## 2022-01-10 ENCOUNTER — Other Ambulatory Visit: Payer: Self-pay | Admitting: Family Medicine

## 2022-01-10 DIAGNOSIS — R1011 Right upper quadrant pain: Secondary | ICD-10-CM

## 2022-01-16 ENCOUNTER — Telehealth: Payer: Self-pay | Admitting: Gastroenterology

## 2022-01-16 NOTE — Telephone Encounter (Signed)
Hi Dr. Silverio Decamp, ? ? ?We received a referral for RUQ abdominal pain the patient is requesting a transfer of care over to Dr. Henrene Pastor due to Dr. Henrene Pastor being his mother in [place] provider. ([name]). ? ?Please advise on approval. ? ?Thanks  ?

## 2022-01-16 NOTE — Telephone Encounter (Signed)
Okay with me 

## 2022-01-16 NOTE — Telephone Encounter (Signed)
Sure, not a problem if its fine with Dr. Henrene Pastor. ?I only saw patient once for screening colonoscopy in 2021. ?

## 2022-01-16 NOTE — Telephone Encounter (Signed)
Hi Dr. Henrene Pastor, ? ? ?Please advise on approval for patient requesting a transfer. ? ? ?Thanks ? ? ?

## 2022-01-17 NOTE — Telephone Encounter (Signed)
Called patient to schedule left voicemail. 

## 2022-01-18 ENCOUNTER — Ambulatory Visit: Payer: Medicare Other | Admitting: Podiatry

## 2022-01-18 ENCOUNTER — Other Ambulatory Visit: Payer: Self-pay

## 2022-01-18 ENCOUNTER — Encounter: Payer: Self-pay | Admitting: Podiatry

## 2022-01-18 DIAGNOSIS — M7752 Other enthesopathy of left foot: Secondary | ICD-10-CM

## 2022-01-18 DIAGNOSIS — Q828 Other specified congenital malformations of skin: Secondary | ICD-10-CM | POA: Diagnosis not present

## 2022-01-19 ENCOUNTER — Ambulatory Visit: Payer: Medicare Other | Admitting: Podiatry

## 2022-01-21 NOTE — Progress Notes (Signed)
Subjective:  ? ?Patient ID: Brian Villanueva, male   DOB: 66 y.o.   MRN: [id number]  ? ?HPI ?Patient presents stating he has developed quite a bit of discomfort in the outside of his left foot again with fluid buildup but stated it did very well for at least 6 to 8 months ? ? ?ROS ? ? ?   ?Objective:  ?Physical Exam  ?Neuro vascular status intact with inflammation pain around the fifth MPJ left and has keratotic lesions of the fifth subfourth metatarsal left ? ?   ?Assessment:  ?Inflammatory capsulitis of the fifth MPJ left with lesion formation x2 ? ?   ?Plan:  ?H&P reviewed condition and the possibility for surgery at 1 point in future though I do not think this would be good for him and we will avoid at all costs.  Today I did do a sterile prep and I injected the subfifth MPJ 3 mg dexamethasone Kenalog 5 mg Xylocaine I debrided several lesions no iatrogenic bleeding reappoint as needed when symptomatic which will guide his future ?   ? ? ?

## 2022-01-22 ENCOUNTER — Other Ambulatory Visit: Payer: Self-pay | Admitting: Family Medicine

## 2022-02-14 ENCOUNTER — Encounter: Payer: Self-pay | Admitting: Internal Medicine

## 2022-02-14 ENCOUNTER — Ambulatory Visit: Payer: Medicare Other | Admitting: Internal Medicine

## 2022-02-14 VITALS — BP 124/80 | HR 65 | Ht 75.0 in | Wt 277.5 lb

## 2022-02-14 DIAGNOSIS — M542 Cervicalgia: Secondary | ICD-10-CM

## 2022-02-14 DIAGNOSIS — M549 Dorsalgia, unspecified: Secondary | ICD-10-CM | POA: Diagnosis not present

## 2022-02-14 NOTE — Progress Notes (Signed)
HISTORY OF PRESENT ILLNESS: ? ?Brian Villanueva is a 66 y.o. male, son-in-law of Derrell Lolling, who presents today regarding chronic right upper quadrant/right back pain.  The patient is accompanied by his wife.  He reports a 6 to 1-monthhistory of problems with right subscapular/right upper quadrant discomfort exacerbated by movements.  He reports that after eating a M[place]meal on New Year's the pain was excruciating.  [place] were at the beach.  He was unable to carry luggage during this timeframe.  He saw his PCP who ordered abdominal ultrasound, MRI, and HIDA scan.  These were unrevealing.  Blood work from January 2023 was unremarkable including normal liver test and normal CBC.  Patient reports that with certain movements of his neck the discomfort is more noticeable and with other movements, there is relief.  He denies a pleuritic component.  No cough.  No weight loss.  He has had prior lumbar surgery remotely.  For the most part, this is not affected by meals.  He did undergo colonoscopy with Dr. NSilverio DecampMay 2021.  Small hyperplastic polyps only.  Otherwise normal.  Follow-up in 10 years. ? ?REVIEW OF SYSTEMS: ? ?All non-GI ROS negative unless otherwise stated in the HPI except for anxiety, back pain, depression, muscle cramps, night sweats ? ?Past Medical History:  ?Diagnosis Date  ? Arthritis   ? COVID-19 virus infection 03/17/2021  ? GERD (gastroesophageal reflux disease)   ? ? ?Past Surgical History:  ?Procedure Laterality Date  ? COLONOSCOPY  03/2020  ? [facility], rpt 10 yrs (Nandigam)  ? kidney stone removal    ? 20+ years ago   ? KNEE SURGERY Left   ? x2 growing up - ligament tears  ? SPINAL FUSION    ? WISDOM TOOTH EXTRACTION    ? ? ?Social History ?JRIKU BUTTERY reports that he has been smoking cigars. He has never used smokeless tobacco. He reports current alcohol use. He reports that he does not use drugs. ? ?family history includes Arthritis in his maternal grandfather; Atrial fibrillation in his father;  Cancer in his paternal grandmother; Esophageal cancer in his mother; High Cholesterol in his father; High blood pressure in his father. ? ?No Known Allergies ? ?  ? ?PHYSICAL EXAMINATION: ?Vital signs: BP 124/80   Pulse 65   Ht 6' 3"  (1.905 m)   Wt 277 lb 8 oz (125.9 kg)   BMI 34.69 kg/m?   ?Constitutional: generally well-appearing, no acute distress ?Psychiatric: alert and oriented x3, cooperative ?Eyes: extraocular movements intact, anicteric, conjunctiva pink ?Mouth: oral pharynx moist, no lesions ?Neck: supple no lymphadenopathy ?Cardiovascular: heart regular rate and rhythm, no murmur ?Lungs: clear to auscultation bilaterally ?Abdomen: soft, nontender, nondistended, no obvious ascites, no peritoneal signs, normal bowel sounds, no organomegaly ?Rectal: Omitted ?Extremities: no clubbing, cyanosis, or lower extremity edema bilaterally ?Skin: no lesions on visible extremities ?Neuro: No focal deficits.  Cranial nerves intact ?MUSCULOSKELETAL: When the patient turns his neck to the right with resistance and raises his right arm with resistance this exacerbates the pain ? ?ASSESSMENT: ? ?1.  Musculoskeletal pain.  Rule out spinal disorder such as disc (thoracic more likely based on distribution than cervical).  No evidence for primary GI disorder. ?2.  Normal screening colonoscopy 2021 ? ?PLAN: ? ?1.  MRI of the cervical and thoracic spine. ?2.  Triage based on the results. ?3.  He will resume care with his PCP regarding ultimate management ?A total time of 45 minutes was spent preparing to  see the patient, reviewing a myriad of tests, obtaining comprehensive history, performing comprehensive physical exam, counseling the patient and his wife regarding the above listed issues, ordering advanced radiology study, and documenting clinical information in the health record ? ? ? ?  ?

## 2022-02-14 NOTE — Patient Instructions (Signed)
If you are age 68 or older, your body mass index should be between 23-30. Your Body mass index is 34.69 kg/m?Marland Kitchen If this is out of the aforementioned range listed, please consider follow up with your Primary Care Provider. ? ?If you are age 93 or younger, your body mass index should be between 19-25. Your Body mass index is 34.69 kg/m?Marland Kitchen If this is out of the aformentioned range listed, please consider follow up with your Primary Care Provider.  ? ?________________________________________________________ ? ?The [facility] providers would like to encourage you to use [facility] to communicate with providers for non-urgent requests or questions.  Due to long hold times on the telephone, sending your provider a message by [facility] may be a faster and more efficient way to get a response.  Please allow 48 business hours for a response.  Please remember that this is for non-urgent requests.  ?_______________________________________________________ ? ?You will be contacted by [facility] in the next 2 days to arrange an MRI  The number on your caller ID will be [phone], please answer when they call.  If you have not heard from them in 2 days please call [phone] to schedule.   ? ?

## 2022-02-25 ENCOUNTER — Other Ambulatory Visit: Payer: Self-pay | Admitting: Family Medicine

## 2022-02-27 ENCOUNTER — Ambulatory Visit (HOSPITAL_COMMUNITY)
Admission: RE | Admit: 2022-02-27 | Discharge: 2022-02-27 | Disposition: A | Discharge status: Home / Self Care | Payer: Medicare Other | Source: Ambulatory Visit | Attending: Internal Medicine | Admitting: Internal Medicine

## 2022-02-27 DIAGNOSIS — M549 Dorsalgia, unspecified: Secondary | ICD-10-CM | POA: Diagnosis not present

## 2022-02-27 DIAGNOSIS — M542 Cervicalgia: Secondary | ICD-10-CM | POA: Insufficient documentation

## 2022-02-27 DIAGNOSIS — M40204 Unspecified kyphosis, thoracic region: Secondary | ICD-10-CM | POA: Diagnosis not present

## 2022-02-27 DIAGNOSIS — M50222 Other cervical disc displacement at C5-C6 level: Secondary | ICD-10-CM | POA: Diagnosis not present

## 2022-02-27 DIAGNOSIS — M50223 Other cervical disc displacement at C6-C7 level: Secondary | ICD-10-CM | POA: Diagnosis not present

## 2022-02-27 DIAGNOSIS — M50221 Other cervical disc displacement at C4-C5 level: Secondary | ICD-10-CM | POA: Diagnosis not present

## 2022-02-27 DIAGNOSIS — M546 Pain in thoracic spine: Secondary | ICD-10-CM | POA: Diagnosis not present

## 2022-02-27 MED ORDER — GADOBUTROL 1 MMOL/ML IV SOLN
10.0000 mL | Freq: Once | INTRAVENOUS | Status: AC | PRN
  Administered 2022-02-27: 10 mL via INTRAVENOUS

## 2022-03-05 ENCOUNTER — Encounter: Payer: Self-pay | Admitting: Family Medicine

## 2022-03-09 ENCOUNTER — Telehealth: Payer: Self-pay | Admitting: Family Medicine

## 2022-03-09 NOTE — Telephone Encounter (Signed)
Left message for patient to call back and schedule the Medicare Annual Wellness Visit (AWV) virtually or by telephone. ? ?Last AWV 03/08/21 ? ?Please schedule at anytime with Nurse Health Advisor. ? ? ?

## 2022-04-02 ENCOUNTER — Other Ambulatory Visit: Payer: Self-pay | Admitting: Family Medicine

## 2022-04-03 ENCOUNTER — Ambulatory Visit (INDEPENDENT_AMBULATORY_CARE_PROVIDER_SITE_OTHER): Payer: Medicare Other

## 2022-04-03 VITALS — Ht 75.0 in | Wt 270.0 lb

## 2022-04-03 DIAGNOSIS — Z0000 Encounter for general adult medical examination without abnormal findings: Secondary | ICD-10-CM | POA: Diagnosis not present

## 2022-04-03 NOTE — Patient Instructions (Addendum)
Brian Villanueva , Thank you for taking time to come for your Medicare Wellness Visit. I appreciate your ongoing commitment to your health goals. Please review the following plan we discussed and let me know if I can assist you in the future.   These are the goals we discussed:  Goals       Patient Stated      03/03/2020, I will maintain and continue medications as prescribed.       Patient Stated      03/08/2021, I will maintain and continue medications as prescribed.      Patient stated (pt-stated)      I would like to lose a little more weight        This is a list of the screening recommended for you and due dates:  Health Maintenance  Topic Date Due   COVID-19 Vaccine (4 - Booster for Pfizer series) 04/19/2022*   Zoster (Shingles) Vaccine (1 of 2) 07/04/2022*   Tetanus Vaccine  02/11/2024*   Flu Shot  06/12/2022   Colon Cancer Screening  03/17/2030   Pneumonia Vaccine  Completed   Hepatitis C Screening: USPSTF Recommendation to screen - Ages 18-79 yo.  Completed   HPV Vaccine  Aged Out  *Topic was postponed. The date shown is not the original due date.    Advanced directives: No  Conditions/risks identified: None  Next appointment: Follow up in one year for your annual wellness visit.    Preventive Care 13 Years and Older, Male Preventive care refers to lifestyle choices and visits with your health care provider that can promote health and wellness. What does preventive care include? A yearly physical exam. This is also called an annual well check. Dental exams once or twice a year. Routine eye exams. Ask your health care provider how often you should have your eyes checked. Personal lifestyle choices, including: Daily care of your teeth and gums. Regular physical activity. Eating a healthy diet. Avoiding tobacco and drug use. Limiting alcohol use. Practicing safe sex. Taking low doses of aspirin every day. Taking vitamin and mineral supplements as recommended by  your health care provider. What happens during an annual well check? The services and screenings done by your health care provider during your annual well check will depend on your age, overall health, lifestyle risk factors, and family history of disease. Counseling  Your health care provider may ask you questions about your: Alcohol use. Tobacco use. Drug use. Emotional well-being. Home and relationship well-being. Sexual activity. Eating habits. History of falls. Memory and ability to understand (cognition). Work and work Statistician. Screening  You may have the following tests or measurements: Height, weight, and BMI. Blood pressure. Lipid and cholesterol levels. These may be checked every 5 years, or more frequently if you are over 89 years old. Skin check. Lung cancer screening. You may have this screening every year starting at age 63 if you have a 30-pack-year history of smoking and currently smoke or have quit within the past 15 years. Fecal occult blood test (FOBT) of the stool. You may have this test every year starting at age 28. Flexible sigmoidoscopy or colonoscopy. You may have a sigmoidoscopy every 5 years or a colonoscopy every 10 years starting at age 58. Prostate cancer screening. Recommendations will vary depending on your family history and other risks. Hepatitis C blood test. Hepatitis B blood test. Sexually transmitted disease (STD) testing. Diabetes screening. This is done by checking your blood sugar (glucose) after you have not  eaten for a while (fasting). You may have this done every 1-3 years. Abdominal aortic aneurysm (AAA) screening. You may need this if you are a current or former smoker. Osteoporosis. You may be screened starting at age 19 if you are at high risk. Talk with your health care provider about your test results, treatment options, and if necessary, the need for more tests. Vaccines  Your health care provider may recommend certain vaccines,  such as: Influenza vaccine. This is recommended every year. Tetanus, diphtheria, and acellular pertussis (Tdap, Td) vaccine. You may need a Td booster every 10 years. Zoster vaccine. You may need this after age 70. Pneumococcal 13-valent conjugate (PCV13) vaccine. One dose is recommended after age 67. Pneumococcal polysaccharide (PPSV23) vaccine. One dose is recommended after age 72. Talk to your health care provider about which screenings and vaccines you need and how often you need them. This information is not intended to replace advice given to you by your health care provider. Make sure you discuss any questions you have with your health care provider. Document Released: 11/25/2015 Document Revised: 07/18/2016 Document Reviewed: 08/30/2015 Elsevier Interactive Patient Education  2017 [employer] Prevention in the Home Falls can cause injuries. They can happen to people of all ages. There are many things you can do to make your home safe and to help prevent falls. What can I do on the outside of my home? Regularly fix the edges of walkways and driveways and fix any cracks. Remove anything that might make you trip as you walk through a door, such as a raised step or threshold. Trim any bushes or trees on the path to your home. Use bright outdoor lighting. Clear any walking paths of anything that might make someone trip, such as rocks or tools. Regularly check to see if handrails are loose or broken. Make sure that both sides of any steps have handrails. Any raised decks and porches should have guardrails on the edges. Have any leaves, snow, or ice cleared regularly. Use sand or salt on walking paths during winter. Clean up any spills in your garage right away. This includes oil or grease spills. What can I do in the bathroom? Use night lights. Install grab bars by the toilet and in the tub and shower. Do not use towel bars as grab bars. Use non-skid mats or decals in the tub or  shower. If you need to sit down in the shower, use a plastic, non-slip stool. Keep the floor dry. Clean up any water that spills on the floor as soon as it happens. Remove soap buildup in the tub or shower regularly. Attach bath mats securely with double-sided non-slip rug tape. Do not have throw rugs and other things on the floor that can make you trip. What can I do in the bedroom? Use night lights. Make sure that you have a light by your bed that is easy to reach. Do not use any sheets or blankets that are too big for your bed. They should not hang down onto the floor. Have a firm chair that has side arms. You can use this for support while you get dressed. Do not have throw rugs and other things on the floor that can make you trip. What can I do in the kitchen? Clean up any spills right away. Avoid walking on wet floors. Keep items that you use a lot in easy-to-reach places. If you need to reach something above you, use a strong step stool that  has a grab bar. Keep electrical cords out of the way. Do not use floor polish or wax that makes floors slippery. If you must use wax, use non-skid floor wax. Do not have throw rugs and other things on the floor that can make you trip. What can I do with my stairs? Do not leave any items on the stairs. Make sure that there are handrails on both sides of the stairs and use them. Fix handrails that are broken or loose. Make sure that handrails are as long as the stairways. Check any carpeting to make sure that it is firmly attached to the stairs. Fix any carpet that is loose or worn. Avoid having throw rugs at the top or bottom of the stairs. If you do have throw rugs, attach them to the floor with carpet tape. Make sure that you have a light switch at the top of the stairs and the bottom of the stairs. If you do not have them, ask someone to add them for you. What else can I do to help prevent falls? Wear shoes that: Do not have high heels. Have  rubber bottoms. Are comfortable and fit you well. Are closed at the toe. Do not wear sandals. If you use a stepladder: Make sure that it is fully opened. Do not climb a closed stepladder. Make sure that both sides of the stepladder are locked into place. Ask someone to hold it for you, if possible. Clearly mark and make sure that you can see: Any grab bars or handrails. First and last steps. Where the edge of each step is. Use tools that help you move around (mobility aids) if they are needed. These include: Canes. Walkers. Scooters. Crutches. Turn on the lights when you go into a dark area. Replace any light bulbs as soon as they burn out. Set up your furniture so you have a clear path. Avoid moving your furniture around. If any of your floors are uneven, fix them. If there are any pets around you, be aware of where they are. Review your medicines with your doctor. Some medicines can make you feel dizzy. This can increase your chance of falling. Ask your doctor what other things that you can do to help prevent falls. This information is not intended to replace advice given to you by your health care provider. Make sure you discuss any questions you have with your health care provider. Document Released: 08/25/2009 Document Revised: 04/05/2016 Document Reviewed: 12/03/2014 Elsevier Interactive Patient Education  2017 [employer].

## 2022-04-03 NOTE — Progress Notes (Signed)
Subjective:   Brian Villanueva is a 66 y.o. male who presents for Medicare Annual/Subsequent preventive examination.  Review of Systems    Virtual Visit via Telephone Note  I connected with  Brian Villanueva on 04/03/22 at  8:45 AM EDT by telephone and verified that I am speaking with the correct person using two identifiers.  Location: Patient: Home Provider: Office Persons participating in the virtual visit: patient/Nurse Health Advisor   I discussed the limitations, risks, security and privacy concerns of performing an evaluation and management service by telephone and the availability of in person appointments. The patient expressed understanding and agreed to proceed.  Interactive audio and video telecommunications were attempted between this nurse and patient, however failed, due to patient having technical difficulties OR patient did not have access to video capability.  We continued and completed visit with audio only.  Some vital signs may be absent or patient reported.   Criselda Peaches, LPN  Cardiac Risk Factors include: advanced age (>66mn, >>45women);male gender;smoking/ tobacco exposure     Objective:    Today's Vitals   04/03/22 0847  Weight: 270 lb (122.5 kg)  Height: '6\' 3"'$  (1.905 m)   Body mass index is 33.75 kg/m.     04/03/2022    9:00 AM 03/08/2021    1:15 PM 03/03/2020    9:48 AM  Advanced Directives  Does Patient Have a Medical Advance Directive? No No No  Would patient like information on creating a medical advance directive? No - Patient declined No - Patient declined No - Patient declined    Current Medications (verified) Outpatient Encounter Medications as of 04/03/2022  Medication Sig   Cholecalciferol (VITAMIN D3) 50 MCG (2000 UT) TABS Take 1 tablet by mouth daily.   Multiple Vitamins-Minerals (CENTRUM SILVER 50+MEN PO) Take by mouth daily.   omeprazole (PRILOSEC) 40 MG capsule Take 1 capsule (40 mg total) by mouth daily as needed.   vitamin C  (VITAMIN C) 500 MG tablet Take 2 tablets (1,000 mg total) by mouth daily.   Zinc 22.5 MG TABS Take 1 tablet by mouth daily.   No facility-administered encounter medications on file as of 04/03/2022.    Allergies (verified) Patient has no known allergies.   History: Past Medical History:  Diagnosis Date   Arthritis    COVID-19 virus infection 03/17/2021   GERD (gastroesophageal reflux disease)    Past Surgical History:  Procedure Laterality Date   COLONOSCOPY  03/2020   HP, rpt 10 yrs ([place])   kidney stone removal     20+ years ago    KNEE SURGERY Left    x2 growing up - ligament tears   SPINAL FUSION     WISDOM TOOTH EXTRACTION     Family History  Problem Relation Age of Onset   Esophageal cancer Mother    High Cholesterol Father    High blood pressure Father    Atrial fibrillation Father    Arthritis Maternal Grandfather    Cancer Paternal Grandmother        unsure   Colon polyps Neg Hx    Colon cancer Neg Hx    Rectal cancer Neg Hx    Stomach cancer Neg Hx    Social History   Socioeconomic History   Marital status: Married    Spouse name: Not on file   Number of children: Not on file   Years of education: Not on file   Highest education level: Not on file  Occupational History   Not on file  Tobacco Use   Smoking status: Light Smoker    Types: Cigars   Smokeless tobacco: Never  Vaping Use   Vaping Use: Never used  Substance and Sexual Activity   Alcohol use: Yes    Comment: social - a few a month   Drug use: Never   Sexual activity: Yes    Partners: Female  Other Topics Concern   Not on file  Social History Narrative   Lives with wife and 69yo grand daughter living at home   Occupation: Medical illustrator with [employer]   Edu: HS   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   Diet: good water, fruits/vegetables daily   Social Determinants of Radio broadcast assistant Strain: Low Risk    Difficulty of Paying Living Expenses: Not hard at all  Food  Insecurity: No Food Insecurity   Worried About Charity fundraiser in the Last Year: Never true   Arboriculturist in the Last Year: Never true  Transportation Needs: No Transportation Needs   Lack of Transportation (Medical): No   Lack of Transportation (Non-Medical): No  Physical Activity: Insufficiently Active   Days of Exercise per Week: 6 days   Minutes of Exercise per Session: 20 min  Stress: No Stress Concern Present   Feeling of Stress : Not at all  Social Connections: Socially Integrated   Frequency of Communication with Friends and Family: More than three times a week   Frequency of Social Gatherings with Friends and Family: More than three times a week   Attends Religious Services: More than 4 times per year   Active Member of [employer] or Organizations: Yes   Attends Music therapist: More than 4 times per year   Marital Status: Married    Tobacco Counseling Ready to quit: No Counseling given: Yes   Clinical Intake:  Pre-visit preparation completed: NoDiabetic?  No  Activities of Daily Living    04/03/2022    8:58 AM  In your present state of health, do you have any difficulty performing the following activities:  Hearing? 0  Vision? 0  Difficulty concentrating or making decisions? 0  Walking or climbing stairs? 0  Dressing or bathing? 0  Doing errands, shopping? 0  Preparing Food and eating ? N  Using the Toilet? N  In the past six months, have you accidently leaked urine? N  Do you have problems with loss of bowel control? N  Managing your Medications? N  Managing your Finances? N  Housekeeping or managing your Housekeeping? N    Patient Care Team: Ria Bush, MD as PCP - General (Family Medicine)  Indicate any recent Medical Services you may have received from other than [facility] providers in the past year (date may be approximate).     Assessment:   This is a routine wellness examination for Brian Villanueva.  Hearing/Vision screen Hearing  Screening - Comments:: No hearing difficulty Vision Screening - Comments:: Wears glasses. Followed by [facility]  Dietary issues and exercise activities discussed: Exercise limited by: None identified   Goals Addressed               This Visit's Progress     Patient stated (pt-stated)        I would like to lose a little more weight       Depression Screen    04/03/2022    8:54 AM 03/08/2021    1:17 PM  03/03/2020    9:48 AM 02/22/2020   12:18 PM  PHQ 2/9 Scores  PHQ - 2 Score 0 0 0 0  PHQ- 9 Score  0 0 2    Fall Risk    04/03/2022    8:59 AM 03/08/2021    1:16 PM 03/03/2020    9:48 AM  Fall Risk   Falls in the past year? 0 0 0  Number falls in past yr: 0 0 0  Injury with Fall? 0 0 0  Risk for fall due to : No Fall Risks No Fall Risks No Fall Risks  Follow up  Falls evaluation completed;Falls prevention discussed Falls evaluation completed;Falls prevention discussed    FALL RISK PREVENTION PERTAINING TO THE HOME:  Any stairs in or around the home? Yes  If so, are there any without handrails? No  Home free of loose throw rugs in walkways, pet beds, electrical cords, etc? Yes  Adequate lighting in your home to reduce risk of falls? Yes   ASSISTIVE DEVICES UTILIZED TO PREVENT FALLS:  Life alert? No  Use of a cane, walker or w/c? No  Grab bars in the bathroom? No  Shower chair or bench in shower? Yes  Elevated toilet seat or a handicapped toilet? No   TIMED UP AND GO:  Was the test performed? No . Audio Visit  Cognitive Function:    03/08/2021    1:22 PM 03/03/2020    9:49 AM  MMSE - Mini Mental State Exam  Not completed: Refused   Orientation to time  5  Orientation to Place  5  Registration  3  Attention/ Calculation  5  Recall  3  Language- repeat  1        04/03/2022    9:00 AM  6CIT Screen  What Year? 0 points  What month? 0 points  What time? 0 points  Count back from 20 0 points  Months in reverse 0 points  Repeat phrase 0 points   Total Score 0 points    Immunizations Immunization History  Administered Date(s) Administered   Influenza, High Dose Seasonal PF 08/22/2020   Influenza-Unspecified 07/31/2019   PFIZER(Purple Top)SARS-COV-2 Vaccination 12/25/2019, 01/19/2020, 10/14/2020   PNEUMOCOCCAL CONJUGATE-20 05/23/2021   Pneumococcal Polysaccharide-23 03/10/2020    TDAP status: Due, Education has been provided regarding the importance of this vaccine. Advised may receive this vaccine at local pharmacy or Health Dept. Aware to provide a copy of the vaccination record if obtained from local pharmacy or Health Dept. Verbalized acceptance and understanding.  Flu Vaccine status: Declined, Education has been provided regarding the importance of this vaccine but patient still declined. Advised may receive this vaccine at local pharmacy or Health Dept. Aware to provide a copy of the vaccination record if obtained from local pharmacy or Health Dept. Verbalized acceptance and understanding.  Pneumococcal vaccine status: Up to date  Covid-19 vaccine status: Completed vaccines  Qualifies for Shingles Vaccine? Yes  Zostavax completed No   Shingrix Completed?: No.    Education has been provided regarding the importance of this vaccine. Patient has been advised to call insurance company to determine out of pocket expense if they have not yet received this vaccine. Advised may also receive vaccine at local pharmacy or Health Dept. Verbalized acceptance and understanding.  Screening Tests Health Maintenance  Topic Date Due   COVID-19 Vaccine (4 - Booster for Pfizer series) 04/19/2022 (Originally 12/09/2020)   Zoster Vaccines- Shingrix (1 of 2) 07/04/2022 (Originally 12/28/2003)   TETANUS/TDAP  02/11/2024 (Originally [birth year]/1972)   INFLUENZA VACCINE  06/12/2022   COLONOSCOPY (Pts 45-4yr Insurance coverage will need to be confirmed)  03/17/2030   Pneumonia Vaccine 66 Years old  Completed   Hepatitis C Screening  Completed   HPV  VACCINES  Aged Out    Health Maintenance  There are no preventive care reminders to display for this patient.   Colorectal cancer screening: Type of screening: Colonoscopy. Completed 03/17/20. Repeat every 10 years  Lung Cancer Screening: (Low Dose CT Chest recommended if Age 68-80years, 30 pack-year currently smoking OR have quit w/in 15years.) does qualify.   Lung Cancer Screening Referral: Patient deffered  Additional Screening:  Hepatitis C Screening: does qualify; Completed 03/13/21  Vision Screening: Recommended annual ophthalmology exams for early detection of glaucoma and other disorders of the eye. Is the patient up to date with their annual eye exam?  Yes  Who is the provider or what is the name of the office in which the patient attends annual eye exams[facility] If pt is not established with a provider, would they like to be referred to a provider to establish care? No .   Dental Screening: Recommended annual dental exams for proper oral hygiene  Community Resource Referral / Chronic Care Management:  CRR required this visit?  No   CCM required this visit?  No      Plan:     I have personally reviewed and noted the following in the patient's chart:   Medical and social history Use of alcohol, tobacco or illicit drugs  Current medications and supplements including opioid prescriptions. Patient is not currently taking opioid prescriptions. Functional ability and status Nutritional status Physical activity Advanced directives List of other physicians Hospitalizations, surgeries, and ER visits in previous 12 months Vitals Screenings to include cognitive, depression, and falls Referrals and appointments  In addition, I have reviewed and discussed with patient certain preventive protocols, quality metrics, and best practice recommendations. A written personalized care plan for preventive services as well as general preventive health recommendations were  provided to patient.     BCriselda Peaches LPN   [id number]/22/[id number]  Nurse Notes: None

## 2022-05-01 ENCOUNTER — Other Ambulatory Visit: Payer: Self-pay | Admitting: Family Medicine

## 2022-06-08 ENCOUNTER — Ambulatory Visit (INDEPENDENT_AMBULATORY_CARE_PROVIDER_SITE_OTHER): Payer: Medicare Other | Admitting: Family Medicine

## 2022-06-08 ENCOUNTER — Ambulatory Visit (INDEPENDENT_AMBULATORY_CARE_PROVIDER_SITE_OTHER)
Admission: RE | Admit: 2022-06-08 | Discharge: 2022-06-08 | Disposition: A | Discharge status: Home / Self Care | Payer: Medicare Other | Source: Ambulatory Visit | Attending: Family Medicine | Admitting: Family Medicine

## 2022-06-08 ENCOUNTER — Encounter: Payer: Self-pay | Admitting: Family Medicine

## 2022-06-08 VITALS — BP 122/70 | HR 69 | Temp 97.5°F | Ht 73.5 in | Wt 278.0 lb

## 2022-06-08 DIAGNOSIS — R400 Somnolence: Secondary | ICD-10-CM

## 2022-06-08 DIAGNOSIS — M546 Pain in thoracic spine: Secondary | ICD-10-CM

## 2022-06-08 DIAGNOSIS — R3129 Other microscopic hematuria: Secondary | ICD-10-CM | POA: Diagnosis not present

## 2022-06-08 DIAGNOSIS — I7143 Infrarenal abdominal aortic aneurysm, without rupture: Secondary | ICD-10-CM

## 2022-06-08 DIAGNOSIS — Z0001 Encounter for general adult medical examination with abnormal findings: Secondary | ICD-10-CM | POA: Diagnosis not present

## 2022-06-08 DIAGNOSIS — E785 Hyperlipidemia, unspecified: Secondary | ICD-10-CM

## 2022-06-08 DIAGNOSIS — G8929 Other chronic pain: Secondary | ICD-10-CM | POA: Diagnosis not present

## 2022-06-08 DIAGNOSIS — R361 Hematospermia: Secondary | ICD-10-CM | POA: Diagnosis not present

## 2022-06-08 DIAGNOSIS — R739 Hyperglycemia, unspecified: Secondary | ICD-10-CM

## 2022-06-08 DIAGNOSIS — K760 Fatty (change of) liver, not elsewhere classified: Secondary | ICD-10-CM

## 2022-06-08 DIAGNOSIS — M545 Low back pain, unspecified: Secondary | ICD-10-CM | POA: Diagnosis not present

## 2022-06-08 DIAGNOSIS — E669 Obesity, unspecified: Secondary | ICD-10-CM

## 2022-06-08 DIAGNOSIS — R7303 Prediabetes: Secondary | ICD-10-CM

## 2022-06-08 DIAGNOSIS — R12 Heartburn: Secondary | ICD-10-CM

## 2022-06-08 DIAGNOSIS — R1011 Right upper quadrant pain: Secondary | ICD-10-CM

## 2022-06-08 DIAGNOSIS — Z7189 Other specified counseling: Secondary | ICD-10-CM

## 2022-06-08 LAB — URINALYSIS, ROUTINE W REFLEX MICROSCOPIC
Bilirubin Urine: NEGATIVE
Ketones, ur: NEGATIVE
Leukocytes,Ua: NEGATIVE
Nitrite: NEGATIVE
Specific Gravity, Urine: 1.02 (ref 1.000–1.030)
Total Protein, Urine: NEGATIVE
Urine Glucose: NEGATIVE
Urobilinogen, UA: 0.2 (ref 0.0–1.0)
pH: 6 (ref 5.0–8.0)

## 2022-06-08 LAB — COMPREHENSIVE METABOLIC PANEL
ALT: 22 U/L (ref 0–53)
AST: 19 U/L (ref 0–37)
Albumin: 4.8 g/dL (ref 3.5–5.2)
Alkaline Phosphatase: 56 U/L (ref 39–117)
BUN: 23 mg/dL (ref 6–23)
CO2: 30 mEq/L (ref 19–32)
Calcium: 9.4 mg/dL (ref 8.4–10.5)
Chloride: 99 mEq/L (ref 96–112)
Creatinine, Ser: 0.95 mg/dL (ref 0.40–1.50)
GFR: 82.28 mL/min (ref >60.00)
Glucose, Bld: 99 mg/dL (ref 70–99)
Potassium: 4.3 mEq/L (ref 3.5–5.1)
Sodium: 137 mEq/L (ref 135–145)
Total Bilirubin: 0.4 mg/dL (ref 0.2–1.2)
Total Protein: 7.8 g/dL (ref 6.0–8.3)

## 2022-06-08 LAB — LIPID PANEL
Cholesterol: 211 mg/dL — ABNORMAL HIGH (ref 0–200)
HDL: 42.8 mg/dL (ref >39.00)
NonHDL: 168.3
Total CHOL/HDL Ratio: 5
Triglycerides: 208 mg/dL — ABNORMAL HIGH (ref 0.0–149.0)
VLDL: 41.6 mg/dL — ABNORMAL HIGH (ref 0.0–40.0)

## 2022-06-08 LAB — CBC WITH DIFFERENTIAL/PLATELET
Basophils Absolute: 0.1 10*3/uL (ref 0.0–0.1)
Basophils Relative: 0.8 % (ref 0.0–3.0)
Eosinophils Absolute: 0.2 10*3/uL (ref 0.0–0.7)
Eosinophils Relative: 3.4 % (ref 0.0–5.0)
HCT: 45 % (ref 39.0–52.0)
Hemoglobin: 15 g/dL (ref 13.0–17.0)
Lymphocytes Relative: 30.9 % (ref 12.0–46.0)
Lymphs Abs: 2.2 10*3/uL (ref 0.7–4.0)
MCHC: 33.3 g/dL (ref 30.0–36.0)
MCV: 93.2 fl (ref 78.0–100.0)
Monocytes Absolute: 0.4 10*3/uL (ref 0.1–1.0)
Monocytes Relative: 4.9 % (ref 3.0–12.0)
Neutro Abs: 4.4 10*3/uL (ref 1.4–7.7)
Neutrophils Relative %: 60 % (ref 43.0–77.0)
Platelets: 172 10*3/uL (ref 150.0–400.0)
RBC: 4.82 Mil/uL (ref 4.22–5.81)
RDW: 14.4 % (ref 11.5–15.5)
WBC: 7.2 10*3/uL (ref 4.0–10.5)

## 2022-06-08 LAB — LDL CHOLESTEROL, DIRECT: Direct LDL: 129 mg/dL

## 2022-06-08 LAB — HEMOGLOBIN A1C: Hgb A1c MFr Bld: 6.2 % (ref 4.6–6.5)

## 2022-06-08 MED ORDER — SERTRALINE HCL 25 MG PO TABS: 25.0000 mg | ORAL_TABLET | Freq: Every day | ORAL | 3 refills | Status: DC

## 2022-06-08 MED ORDER — CYCLOBENZAPRINE HCL 10 MG PO TABS: 5.0000 mg | ORAL_TABLET | Freq: Three times a day (TID) | ORAL | 0 refills | Status: DC | PRN

## 2022-06-08 MED ORDER — OMEPRAZOLE 40 MG PO CPDR: 40.0000 mg | DELAYED_RELEASE_CAPSULE | Freq: Every day | ORAL | 3 refills | Status: DC

## 2022-06-08 NOTE — Progress Notes (Unsigned)
Patient ID: Brian Villanueva, male    DOB: February 13, 1956, 66 y.o.   MRN: [id number]  This visit was conducted in person.  BP 122/70   Pulse 69   Temp (!) 97.5 F (36.4 C) (Temporal)   Ht 6' 1.5" (1.867 m)   Wt 278 lb (126.1 kg)   SpO2 95%   BMI 36.18 kg/m    CC: CPE, back pain f/u visit  Subjective:   HPI: Brian Villanueva is a 66 y.o. male presenting on 06/08/2022 for Annual Exam [facility] prt 2.  Pt accompanied by wife, Karna Christmas. )   Saw health advisor 03/2022 for medicare wellness visit. Note reviewed.   No results found.  [place] Visit from 06/08/2022 in [place] [facility]  PHQ-2 Total Score 2          04/03/2022    8:59 AM 03/08/2021    1:16 PM 03/03/2020    9:48 AM  Fall Risk   Falls in the past year? 0 0 0  Number falls in past yr: 0 0 0  Injury with Fall? 0 0 0  Risk for fall due to : No Fall Risks No Fall Risks No Fall Risks  Follow up  Falls evaluation completed;Falls prevention discussed Falls evaluation completed;Falls prevention discussed   Seen 11/2021 with RUQ abd pain and R infrascapular pain concerning for gallstones however RUQ [place] without gallstones and labs returned reassuringly normal. US showed fatty liver and liver mass. Abdominal MRI returned normal liver (no gallstone, no cirrhosis, no fatty liver. MRI also showed 2.6cm L hemorrhagic cyst and larger 5.4cm L lower pole cyst, both benign (Bosniak 2, 1 respectively), R kidney normal. Gallbladder emptying study returned normal as well. Referred to GI - thought not GI related, s/p thoracic and cervical MRI showing significant arthritis changes: MRI cervical and thoracic spine with/without contrast 02/2022 IMPRESSION: 1. Multilevel uncovertebral and facet arthropathy in the cervical spine, left-greater-than-right, which is most advanced at C6-C7, where there is moderate to severe left neural foraminal narrowing. No cervical spinal canal stenosis. 2. T2-T3 mild right neural foraminal narrowing. No  thoracic spinal canal stenosis or additional neural foraminal narrowing. 3. No abnormal enhancement in the cervical or thoracic spine.  Notes ongoing R lower to mid back for years. Acutely worse 11/2021 without inciting trauma/injury or falls. Spends most of the day seated. Feels has good ergonomic setup at work.  Describes spasm/knot to lower thoracic back below RIGHT shoulder blade.  Pain exacerbated by carrying 2yo grandson.  Wife sees [place].  Has taken wife's diclofenac as well as OTC tylenol with limited benefit.   S/p lumbar spine fusion L5/S1 age 23 yo s/p injury with football helmet - had lumbar spinal fusion. No problems since.   Dark brown semen since 09/2021. Denies inciting trauma/injury to groin. No urinary changes, dysuria, or prostate (rectal or pelvic) pain. 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 Irritability at home.  Daytime somnolence with snoring and wife notes apnea. No PNDyspnea.   Preventative: Colonoscopy 03/2020 - [facility], rpt 10 yrs (Nandigam)  Prostate cancer screening - asxs. Screen yearly.  Lung cancer screening - not eligible Flu shot - yearly at [facility] Tetanus - unsure COVID vaccine - pfizer 2/201, 01/2020, booster 10/2020  Pneumovax23 - 02/2020. Prevnar- 20 05/2021 Shingrix - discussed. To check with pharmacy.  Advanced directive discussion - does not have. Packet provided. Would want wife or son to be HCPOA.  Seat belt use discussed Sunscreen use discussed.  No changing moles on skin. 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 Interim medical history since our last visit reviewed. Allergies and medications reviewed and updated. Outpatient Medications Prior to Visit  Medication Sig Dispense Refill   Cholecalciferol (VITAMIN D3) 50 MCG (2000 UT) TABS Take 1 tablet by mouth daily.     Multiple Vitamins-Minerals (CENTRUM SILVER 50+MEN PO) Take by mouth daily.     vitamin C (VITAMIN C) 500 MG tablet Take 2 tablets (1,000 mg total) by mouth daily.     Zinc 22.5 MG TABS Take 1 tablet by mouth daily.     omeprazole (PRILOSEC) 40 MG capsule TAKE 1 CAPSULE BY MOUTH ONCE DAILY AS NEEDED 30 capsule 0   No facility-administered medications prior to visit.     Per HPI unless specifically indicated in ROS section below Review of Systems  Constitutional:  Negative for activity change, appetite change, chills, fatigue, fever and unexpected weight change.  HENT:  Negative for hearing loss.   Eyes:  Negative for visual disturbance.  Respiratory:  Negative for cough, chest tightness, shortness of breath and wheezing.   Cardiovascular:  Negative for chest pain, palpitations and leg swelling.  Gastrointestinal:  Negative for abdominal distention, abdominal pain, blood in stool, constipation, diarrhea, nausea and vomiting.  Genitourinary:  Negative for difficulty urinating and hematuria.  Musculoskeletal:  Negative for arthralgias, myalgias and neck pain.  Skin:  Negative for rash.  Neurological:  Negative for dizziness, seizures, syncope and headaches.  Hematological:  Negative for adenopathy. Does not bruise/bleed easily.  Psychiatric/Behavioral:  Negative for dysphoric mood. The patient is nervous/anxious.     Objective:  BP 122/70   Pulse 69   Temp (!) 97.5 F (36.4 C) (Temporal)   Ht 6' 1.5" (1.867 m)   Wt 278 lb (126.1 kg)   SpO2 95%   BMI 36.18 kg/m   Wt Readings from Last 3 Encounters:  06/08/22 278 lb (126.1 kg)  04/03/22 270 lb (122.5 kg)  02/14/22 277 lb 8 oz (125.9 kg)       Physical Exam Vitals and nursing note reviewed.  Constitutional:      General: He is not in acute distress.    Appearance: Normal appearance. He is well-developed. He is not ill-appearing.  HENT:     Head: Normocephalic and atraumatic.     Right Ear: Hearing, tympanic membrane, ear canal and external ear normal.     Left Ear: Hearing, tympanic membrane, ear canal and external ear normal.  Eyes:     General: No scleral icterus.    Extraocular Movements: Extraocular movements intact.     Conjunctiva/sclera: Conjunctivae normal.     Pupils: Pupils are equal, round, and reactive to light.  Neck:     Thyroid: No thyroid mass or thyromegaly.     Vascular: No carotid bruit.  Cardiovascular:     Rate and Rhythm: Normal rate and regular rhythm.     Pulses: Normal pulses.          Radial pulses  are 2+ on the right side and 2+ on the left side.     Heart sounds: Normal heart sounds. No murmur heard. Pulmonary:     Effort: Pulmonary effort is normal. No respiratory distress.     Breath sounds: Normal breath sounds. No wheezing, rhonchi or rales.  Abdominal:     General: Bowel sounds are normal. There is no distension.     Palpations: Abdomen is soft. There is no mass.     Tenderness: There is no abdominal tenderness. There is no right CVA tenderness, left CVA tenderness, guarding or rebound. Negative signs include Murphy's sign.     Hernia: No hernia is present.  Genitourinary:    Pubic Area: No rash.      Penis: Normal.      Testes: Normal.        Right: Mass, tenderness or swelling not present.        Left: Mass, tenderness or swelling not present.     Prostate: Normal. Not enlarged, not tender and no nodules present.     Rectum: Normal. No mass, tenderness, anal fissure, external hemorrhoid or internal hemorrhoid. Normal anal tone.     Comments: Limited exam due to body habitus Musculoskeletal:        General: Tenderness present. Normal range of motion.     Cervical back: Normal  range of motion and neck supple.       Back:     Right lower leg: No edema.     Left lower leg: No edema.     Comments: Tenderness to palpation with muscle tightness noted at R mid thoracic spine below scapula, no rash  Lymphadenopathy:     Cervical: No cervical adenopathy.  Skin:    General: Skin is warm and dry.     Findings: No rash.  Neurological:     General: No focal deficit present.     Mental Status: He is alert and oriented to person, place, and time.  Psychiatric:        Mood and Affect: Mood normal.        Behavior: Behavior normal.        Thought Content: Thought content normal.        Judgment: Judgment normal.       Results for orders placed or performed in visit on 06/08/22  Lipid panel  Result Value Ref Range   Cholesterol 211 (H) 0 - 200 mg/dL   Triglycerides 208.0 (H) 0.0 - 149.0 mg/dL   HDL 42.80 >39.00 mg/dL   VLDL 41.6 (H) 0.0 - 40.0 mg/dL   Total CHOL/HDL Ratio 5    NonHDL 168.30   Comprehensive metabolic panel  Result Value Ref Range   Sodium 137 135 - 145 mEq/L   Potassium 4.3 3.5 - 5.1 mEq/L   Chloride 99 96 - 112 mEq/L   CO2 30 19 - 32 mEq/L   Glucose, Bld 99 70 - 99 mg/dL   BUN 23 6 - 23 mg/dL   Creatinine, Ser 0.95 0.40 - 1.50 mg/dL   Total Bilirubin 0.4 0.2 - 1.2 mg/dL   Alkaline Phosphatase 56 39 - 117 U/L   AST 19 0 - 37 U/L   ALT 22 0 - 53 U/L   Total Protein 7.8 6.0 - 8.3 g/dL   Albumin 4.8 3.5 - 5.2 g/dL   GFR 82.28 >60.00 mL/min   Calcium 9.4 8.4 - 10.5 mg/dL  Hemoglobin A1c  Result Value Ref Range   Hgb A1c MFr Bld  6.2 4.6 - 6.5 %  Urinalysis, Routine w reflex microscopic  Result Value Ref Range   Color, Urine YELLOW Yellow;Lt. Yellow;Straw;Dark Yellow;Amber;Green;Red;Brown   APPearance CLEAR Clear;Turbid;Slightly Cloudy;Cloudy   Specific Gravity, Urine 1.020 1.000 - 1.030   pH 6.0 5.0 - 8.0   Total Protein, Urine NEGATIVE Negative   Urine Glucose NEGATIVE Negative   Ketones, ur NEGATIVE Negative   Bilirubin Urine  NEGATIVE Negative   Hgb urine dipstick TRACE-INTACT (A) Negative   Urobilinogen, UA 0.2 0.0 - 1.0   Leukocytes,Ua NEGATIVE Negative   Nitrite NEGATIVE Negative   WBC, UA 0-2/hpf 0-2/hpf   RBC / HPF 3-6/hpf (A) 0-2/hpf   Squamous Epithelial / LPF Rare(0-4/hpf) Rare(0-4/hpf)  CBC with Differential/Platelet  Result Value Ref Range   WBC 7.2 4.0 - 10.5 K/uL   RBC 4.82 4.22 - 5.81 Mil/uL   Hemoglobin 15.0 13.0 - 17.0 g/dL   HCT 45.0 39.0 - 52.0 %   MCV 93.2 78.0 - 100.0 fl   MCHC 33.3 30.0 - 36.0 g/dL   RDW 14.4 11.5 - 15.5 %   Platelets 172.0 150.0 - 400.0 K/uL   Neutrophils Relative % 60.0 43.0 - 77.0 %   Lymphocytes Relative 30.9 12.0 - 46.0 %   Monocytes Relative 4.9 3.0 - 12.0 %   Eosinophils Relative 3.4 0.0 - 5.0 %   Basophils Relative 0.8 0.0 - 3.0 %   Neutro Abs 4.4 1.4 - 7.7 K/uL   Lymphs Abs 2.2 0.7 - 4.0 K/uL   Monocytes Absolute 0.4 0.1 - 1.0 K/uL   Eosinophils Absolute 0.2 0.0 - 0.7 K/uL   Basophils Absolute 0.1 0.0 - 0.1 K/uL  LDL cholesterol, direct  Result Value Ref Range   Direct LDL 129.0 mg/dL    Assessment & Plan:   Problem List Items Addressed This Visit     Encounter for general adult medical examination with abnormal findings - Primary (Chronic)    Preventative protocols reviewed and updated unless pt declined. Discussed healthy diet and lifestyle.  UTD colon cancer screening.  PSA overall reassuring.  Not eligible for lung cancer screening  He will check into shingrix vaccine at pharmacy and is up to date on pneumococcal vaccine.       Advanced directives, counseling/discussion (Chronic)    Advanced directive discussion - does not have. Packet provided today. Would want wife or son to be HCPOA.       Heartburn    Chronic, stable on PRN PPI.       Obesity, Class II, BMI 35-39.9, no comorbidity    Encouraged healthy diet and lifestyle choices to affect sustainable weight loss.       Relevant Orders   Ambulatory referral to Pulmonology    Dyslipidemia    Update lipid levels off medication.  The 10-year ASCVD risk score (Arnett DK, et al., 2019) is: 20.6%   Values used to calculate the score:     Age: 47 years     Sex: Male     Is Non-Hispanic African American: No     Diabetic: No     Tobacco smoker: Yes     Systolic Blood Pressure: 626 mmHg     Is BP treated: No     HDL Cholesterol: 42.8 mg/dL     Total Cholesterol: 211 mg/dL       Relevant Orders   Lipid panel (Completed)   Comprehensive metabolic panel (Completed)   Prediabetes    Update sugar, A1c.  Relevant Orders   Hemoglobin A1c (Completed)   RUQ abdominal pain    Reassuring evaluation negative for gallstones or gallbladder dysfunction. Today endorses ongoing thoracic back pain - see below.       AAA (abdominal aortic aneurysm) ([place])    Incidental finding on abd MRI - rec rpt [place] in 2 yrs from 11/2021.       Fatty liver    Liver MRI reassuringly normal (no fatty liver) 11/2021      Microhematuria    Refer to urology.       Relevant Orders   Urinalysis, Routine w reflex microscopic (Completed)   CBC with Differential/Platelet (Completed)   Ambulatory referral to Urology   Hematospermia    Notes 8 months of brown semen without pain or other symptom.  Check UA today, refer to urology.  Pt agrees with plan.       Relevant Orders   Urinalysis, Routine w reflex microscopic (Completed)   Ambulatory referral to Urology   Chronic right-sided thoracic back pain    Chronic issue.  Negative GI evaluation, no gallstones or gallbladder dysfunction.  Thoracic MRI w/w/o showing T2/3 mild R neural foraminal narrowing, otherwise overall normal without cause found.  Significant R thoracic muscle spasm/tightness on exam.  Will start flexeril muscle relaxant, recommend heating pad. Check baseline lumbar films in h/o remote lumbar spine fusion with autograft. Consider PM&R evaluation for ongoing pain - wife sees [place].       Relevant Medications    sertraline (ZOLOFT) 25 MG tablet   cyclobenzaprine (FLEXERIL) 10 MG tablet   Other Relevant Orders   DG Lumbar Spine Complete (Completed)   Daytime somnolence    Endorses daytime somnolence, loud snoring, wife notes witnessed apnea. Obesity increases risk of OSA. Will refer for sleep apnea evaluation.  ESS = 12      Relevant Orders   Ambulatory referral to Pulmonology     Meds ordered this encounter  Medications   omeprazole (PRILOSEC) 40 MG capsule    Sig: Take 1 capsule (40 mg total) by mouth daily.    Dispense:  90 capsule    Refill:  3   sertraline (ZOLOFT) 25 MG tablet    Sig: Take 1 tablet (25 mg total) by mouth daily.    Dispense:  30 tablet    Refill:  3   cyclobenzaprine (FLEXERIL) 10 MG tablet    Sig: Take 0.5-1 tablets (5-10 mg total) by mouth 3 (three) times daily as needed for muscle spasms (sedation precautions).    Dispense:  30 tablet    Refill:  0   Orders Placed This Encounter  Procedures   DG Lumbar Spine Complete    Standing Status:   Future    Number of Occurrences:   1    Standing Expiration Date:   06/09/2023    Order Specific Question:   Reason for Exam (SYMPTOM  OR DIAGNOSIS REQUIRED)    Answer:   R lower back pain, remote L5/S1 lumbar fusion    Order Specific Question:   Preferred imaging location?    Answer:   [facility]   Lipid panel   Comprehensive metabolic panel   Hemoglobin A1c   Urinalysis, Routine w reflex microscopic   CBC with Differential/Platelet   LDL cholesterol, direct   Ambulatory referral to Urology    Referral Priority:   Routine    Referral Type:   Consultation    Referral Reason:   Specialty Services Required  Requested Specialty:   Urology    Number of Visits Requested:   1   Ambulatory referral to Pulmonology    Referral Priority:   Routine    Referral Type:   Consultation    Referral Reason:   Specialty Services Required    Requested Specialty:   Pulmonary Disease    Number of Visits Requested:   1     Patient instructions: Lower back xrays today. Try flexeril muscle relaxant 1/2-1 tablet at a time. May use heating pad to back.  Labs today, urinalysis today.  If interested, check with pharmacy about new 2 shot shingles series (shingrix).  Work on advanced directive, packet provided today, bring [place] a copy when you complete this.  We will refer you to urology   Follow up plan: Return in about 1 year (around 06/09/2023) for annual exam, prior fasting for blood work, medicare wellness visit.  Ria Bush, MD

## 2022-06-08 NOTE — Patient Instructions (Addendum)
Lower back xrays today. Try flexeril muscle relaxant 1/2-1 tablet at a time. May use heating pad to back.  Labs today, urinalysis today.  If interested, check with pharmacy about new 2 shot shingles series (shingrix).  Work on advanced directive, packet provided today, bring [place] a copy when you complete this.  We will refer you to urology for evaluation of possible blood in semen.   Health Maintenance After Age 68 After age 22, you are at a higher risk for certain long-term diseases and infections as well as injuries from falls. Falls are a major cause of broken bones and head injuries in people who are older than age 60. Getting regular preventive care can help to keep you healthy and well. Preventive care includes getting regular testing and making lifestyle changes as recommended by your health care provider. Talk with your health care provider about: Which screenings and tests you should have. A screening is a test that checks for a disease when you have no symptoms. A diet and exercise plan that is right for you. What should I know about screenings and tests to prevent falls? Screening and testing are the best ways to find a health problem early. Early diagnosis and treatment give you the best chance of managing medical conditions that are common after age 45. Certain conditions and lifestyle choices may make you more likely to have a fall. Your health care provider may recommend: Regular vision checks. Poor vision and conditions such as cataracts can make you more likely to have a fall. If you wear glasses, make sure to get your prescription updated if your vision changes. Medicine review. Work with your health care provider to regularly review all of the medicines you are taking, including over-the-counter medicines. Ask your health care provider about any side effects that may make you more likely to have a fall. Tell your health care provider if any medicines that you take make you feel dizzy or  sleepy. Strength and balance checks. Your health care provider may recommend certain tests to check your strength and balance while standing, walking, or changing positions. Foot health exam. Foot pain and numbness, as well as not wearing proper footwear, can make you more likely to have a fall. Screenings, including: Osteoporosis screening. Osteoporosis is a condition that causes the bones to get weaker and break more easily. Blood pressure screening. Blood pressure changes and medicines to control blood pressure can make you feel dizzy. Depression screening. You may be more likely to have a fall if you have a fear of falling, feel depressed, or feel unable to do activities that you used to do. Alcohol use screening. Using too much alcohol can affect your balance and may make you more likely to have a fall. Follow these instructions at home: Lifestyle Do not drink alcohol if: Your health care provider tells you not to drink. If you drink alcohol: Limit how much you have to: 0-1 drink a day for women. 0-2 drinks a day for men. Know how much alcohol is in your drink. In the [place] one drink equals one 12 oz bottle of beer (355 mL), one 5 oz glass of wine (148 mL), or one 1 oz glass of hard liquor (44 mL). Do not use any products that contain nicotine or tobacco. These products include cigarettes, chewing tobacco, and vaping devices, such as e-cigarettes. If you need help quitting, ask your health care provider. Activity  Follow a regular exercise program to stay fit. This will help you  maintain your balance. Ask your health care provider what types of exercise are appropriate for you. If you need a cane or walker, use it as recommended by your health care provider. Wear supportive shoes that have nonskid soles. Safety  Remove any tripping hazards, such as rugs, cords, and clutter. Install safety equipment such as grab bars in bathrooms and safety rails on stairs. Keep rooms and walkways  well-lit. General instructions Talk with your health care provider about your risks for falling. Tell your health care provider if: You fall. Be sure to tell your health care provider about all falls, even ones that seem minor. You feel dizzy, tiredness (fatigue), or off-balance. Take over-the-counter and prescription medicines only as told by your health care provider. These include supplements. Eat a healthy diet and maintain a healthy weight. A healthy diet includes low-fat dairy products, low-fat (lean) meats, and fiber from whole grains, beans, and lots of fruits and vegetables. Stay current with your vaccines. Schedule regular health, dental, and eye exams. Summary Having a healthy lifestyle and getting preventive care can help to protect your health and wellness after age 26. Screening and testing are the best way to find a health problem early and help you avoid having a fall. Early diagnosis and treatment give you the best chance for managing medical conditions that are more common for people who are older than age 50. Falls are a major cause of broken bones and head injuries in people who are older than age 45. Take precautions to prevent a fall at home. Work with your health care provider to learn what changes you can make to improve your health and wellness and to prevent falls. This information is not intended to replace advice given to you by your health care provider. Make sure you discuss any questions you have with your health care provider. Document Revised: 03/20/2021 Document Reviewed: 03/20/2021 Elsevier Patient Education  [place].

## 2022-06-08 NOTE — Assessment & Plan Note (Signed)
Preventative protocols reviewed and updated unless pt declined. Discussed healthy diet and lifestyle.  UTD colon cancer screening.  PSA overall reassuring.  Not eligible for lung cancer screening  He will check into shingrix vaccine at pharmacy and is up to date on pneumococcal vaccine.

## 2022-06-09 ENCOUNTER — Encounter: Payer: Self-pay | Admitting: Family Medicine

## 2022-06-09 DIAGNOSIS — R400 Somnolence: Secondary | ICD-10-CM | POA: Insufficient documentation

## 2022-06-09 DIAGNOSIS — G8929 Other chronic pain: Secondary | ICD-10-CM | POA: Insufficient documentation

## 2022-06-09 DIAGNOSIS — R361 Hematospermia: Secondary | ICD-10-CM | POA: Insufficient documentation

## 2022-06-09 NOTE — Assessment & Plan Note (Signed)
Encouraged healthy diet and lifestyle choices to affect sustainable weight loss.  ?

## 2022-06-09 NOTE — Assessment & Plan Note (Signed)
Reassuring evaluation negative for gallstones or gallbladder dysfunction. Today endorses ongoing thoracic back pain - see below.

## 2022-06-09 NOTE — Assessment & Plan Note (Signed)
Update sugar, A1c.

## 2022-06-09 NOTE — Assessment & Plan Note (Signed)
Refer to urology.  ?

## 2022-06-09 NOTE — Assessment & Plan Note (Signed)
Incidental finding on abd MRI - rec rpt [place] in 2 yrs from 11/2021.

## 2022-06-09 NOTE — Addendum Note (Signed)
Addended by: Ria Bush on: 06/09/2022 11:14 AM   Modules accepted: Orders

## 2022-06-09 NOTE — Assessment & Plan Note (Signed)
Advanced directive discussion - does not have. Packet provided today. Would want wife or son to be HCPOA.

## 2022-06-09 NOTE — Assessment & Plan Note (Addendum)
Endorses daytime somnolence, loud snoring, wife notes witnessed apnea. Obesity increases risk of OSA. Will refer for sleep apnea evaluation.  ESS = 12

## 2022-06-09 NOTE — Assessment & Plan Note (Signed)
Update lipid levels off medication.  The 10-year ASCVD risk score (Arnett DK, et al., 2019) is: 20.6%   Values used to calculate the score:     Age: 66 years     Sex: Male     Is Non-Hispanic African American: No     Diabetic: No     Tobacco smoker: Yes     Systolic Blood Pressure: 616 mmHg     Is BP treated: No     HDL Cholesterol: 42.8 mg/dL     Total Cholesterol: 211 mg/dL

## 2022-06-09 NOTE — Assessment & Plan Note (Signed)
Liver MRI reassuringly normal (no fatty liver) 11/2021

## 2022-06-09 NOTE — Assessment & Plan Note (Signed)
Chronic, stable on PRN PPI.

## 2022-06-09 NOTE — Assessment & Plan Note (Signed)
Notes 8 months of brown semen without pain or other symptom.  Check UA today, refer to urology.  Pt agrees with plan.

## 2022-06-09 NOTE — Assessment & Plan Note (Addendum)
Chronic issue.  Negative GI evaluation, no gallstones or gallbladder dysfunction.  Thoracic MRI w/w/o showing T2/3 mild R neural foraminal narrowing, otherwise overall normal without cause found.  Significant R thoracic muscle spasm/tightness on exam.  Will start flexeril muscle relaxant, recommend heating pad. Check baseline lumbar films in h/o remote lumbar spine fusion with autograft. Consider PM&R evaluation for ongoing pain - wife sees [place].

## 2022-07-06 ENCOUNTER — Institutional Professional Consult (permissible substitution): Payer: Medicare Other | Admitting: Nurse Practitioner

## 2022-07-25 NOTE — Progress Notes (Signed)
07/27/22- 66 yoM Cigar Smoker for sleep evaluation courtesy or Dr Garlon Hatchet, Danise Mina with concern of EDS, snoring, witnessed apnea suspecting OSA Medical problem list includes AAA, GERD, Fatty Liver, Back Pain, hx Covid infection, Dyslipidemia, Obesity,  Epworth score-9 Body weight today-283 lbs                           Wife is here Covid vax- Flu vax-declines -----Pt states that he has been told that he snores at night. Pt's spouse states that he has stopped breathing while sleeping and pt also stated that he has said that he didn't sleep that well during the night but when he wakes up, he states he feels fine. -He admits that he falls asleep upon sitting and relaxing as he returns home from work.  No sleep medicines.  1 or 2 cups of coffee.  ENT surgery for tonsils as a child.  Not aware of lung or heart problems.  Denies family history of OSA.    Prior to Admission medications   Medication Sig Start Date End Date Taking? Authorizing Provider  Cholecalciferol (VITAMIN D3) 50 MCG (2000 UT) TABS Take 1 tablet by mouth daily.   Yes [provider]  cyclobenzaprine (FLEXERIL) 10 MG tablet Take 0.5-1 tablets (5-10 mg total) by mouth 3 (three) times daily as needed for muscle spasms (sedation precautions). 06/08/22  Yes Ria Bush, MD  Multiple Vitamins-Minerals (CENTRUM SILVER 50+MEN PO) Take by mouth daily.   Yes [provider]  omeprazole (PRILOSEC) 40 MG capsule Take 1 capsule (40 mg total) by mouth daily. 06/08/22  Yes Ria Bush, MD  sertraline (ZOLOFT) 25 MG tablet Take 1 tablet (25 mg total) by mouth daily. 06/08/22  Yes Ria Bush, MD  vitamin C (VITAMIN C) 500 MG tablet Take 2 tablets (1,000 mg total) by mouth daily. 02/22/20  Yes Ria Bush, MD  Zinc 22.5 MG TABS Take 1 tablet by mouth daily.   Yes [provider]   Past Medical History:  Diagnosis Date   AAA (abdominal aortic aneurysm) ([place]) 11/20/2021   Abd [place] 11/2021 - 3.6cm infrarenal  AAA - rec rpt [place] 2 yrs   Arthritis    COVID-19 virus infection 03/17/2021   Dyslipidemia 03/13/2020   GERD (gastroesophageal reflux disease)    Obesity, Class II, BMI 35-39.9, no comorbidity 02/22/2020   Past Surgical History:  Procedure Laterality Date   AUTOGRAFT BONE SPINE     remote lumbar spine fusion with autograft   COLONOSCOPY  03/2020   HP, rpt 10 yrs ([place])   kidney stone removal     20+ years ago    KNEE SURGERY Left    x2 growing up - ligament tears   WISDOM TOOTH EXTRACTION     Family History  Problem Relation Age of Onset   Esophageal cancer Mother    High Cholesterol Father    High blood pressure Father    Atrial fibrillation Father    Arthritis Maternal Grandfather    Cancer Paternal Grandmother        unsure   Colon polyps Neg Hx    Colon cancer Neg Hx    Rectal cancer Neg Hx    Stomach cancer Neg Hx    Social History   Socioeconomic History   Marital status: Married    Spouse name: Not on file   Number of children: Not on file   Years of education: Not on file   Highest education  level: Not on file  Occupational History   Not on file  Tobacco Use   Smoking status: Light Smoker    Types: Cigars   Smokeless tobacco: Never  Vaping Use   Vaping Use: Never used  Substance and Sexual Activity   Alcohol use: Yes    Comment: social - a few a month   Drug use: Never   Sexual activity: Yes    Partners: Female  Other Topics Concern   Not on file  Social History Narrative   Lives with wife and 29yo grand daughter living at home   Occupation: Medical illustrator with [employer]   Edu: HS   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   Diet: good water, fruits/vegetables daily   Social Determinants of Health   Financial Resource Strain: Low Risk  (04/03/2022)   Overall Financial Resource Strain (CARDIA)    Difficulty of Paying Living Expenses: Not hard at all  Food Insecurity: No Food Insecurity (04/03/2022)   Hunger Vital Sign    Worried About Running Out of  Food in the Last Year: Never true    [place] in the Last Year: Never true  Transportation Needs: No Transportation Needs (04/03/2022)   PRAPARE - Hydrologist (Medical): No    Lack of Transportation (Non-Medical): No  Physical Activity: Insufficiently Active (04/03/2022)   Exercise Vital Sign    Days of Exercise per Week: 6 days    Minutes of Exercise per Session: 20 min  Stress: No Stress Concern Present (04/03/2022)   [place]    Feeling of Stress : Not at all  Social Connections: [place] (04/03/2022)   Social Connection and Isolation Panel [NHANES]    Frequency of Communication with Friends and Family: More than three times a week    Frequency of Social Gatherings with Friends and Family: More than three times a week    Attends Religious Services: More than 4 times per year    Active Member of [employer] or Organizations: Yes    Attends Music therapist: More than 4 times per year    Marital Status: Married  Human resources officer Violence: Not At Risk (04/03/2022)   Humiliation, Afraid, Rape, and Kick questionnaire    Fear of Current or Ex-Partner: No    Emotionally Abused: No    Physically Abused: No    Sexually Abused: No   ROS-see HPI   + = positive Constitutional:    weight loss, night sweats, fevers, chills, fatigue, lassitude. HEENT:    headaches, difficulty swallowing, tooth/dental problems, sore throat,       sneezing, itching, ear ache, nasal congestion, post nasal drip, snoring CV:    chest pain, orthopnea, PND, swelling in lower extremities, anasarca,                                  dizziness, palpitations Resp:   shortness of breath with exertion or at rest.                productive cough,   non-productive cough, coughing up of blood.              change in color of mucus.  wheezing.   Skin:    rash or lesions. GI:  No-   heartburn, +indigestion,  abdominal pain, nausea, vomiting, diarrhea,  change in bowel habits, loss of appetite GU: dysuria, change in color of urine, no urgency or frequency.   flank pain. MS:   joint pain, stiffness, decreased range of motion, back pain. Neuro-     nothing unusual Psych:  change in mood or affect.  depression or anxiety.   memory loss.  OBJ- Physical Exam General- Alert, Oriented, Affect-appropriate, Distress- none acute, +big man Skin- rash-none, lesions- none, excoriation- none Lymphadenopathy- none Head- atraumatic            Eyes- Gross vision intact, PERRLA, conjunctivae and secretions clear            Ears- Hearing, canals-normal            Nose- Clear, no-Septal dev, mucus, polyps, erosion, perforation             Throat- Mallampati III-IV , mucosa clear , drainage- none, tonsils absent, +teeth Neck- flexible , trachea midline, no stridor , thyroid nl, carotid no bruit Chest - symmetrical excursion , unlabored           Heart/CV- RRR , no murmur , no gallop  , no rub, nl s1 s2                           - JVD- none , edema- none, stasis changes- none, varices- none           Lung- clear to P&A, wheeze- none, cough- none , dullness-none, rub- none           Chest wall-  Abd-  Br/ Gen/ Rectal- Not done, not indicated Extrem- cyanosis- none, clubbing, none, atrophy- none, strength- nl Neuro- grossly intact to observation

## 2022-07-27 ENCOUNTER — Encounter: Payer: Self-pay | Admitting: Internal Medicine

## 2022-07-27 ENCOUNTER — Ambulatory Visit (INDEPENDENT_AMBULATORY_CARE_PROVIDER_SITE_OTHER): Payer: Medicare Other | Admitting: Internal Medicine

## 2022-07-27 VITALS — BP 132/80 | HR 71 | Temp 98.0°F | Ht 75.0 in | Wt 283.0 lb

## 2022-07-27 DIAGNOSIS — R0683 Snoring: Secondary | ICD-10-CM | POA: Diagnosis not present

## 2022-07-27 DIAGNOSIS — R400 Somnolence: Secondary | ICD-10-CM

## 2022-07-27 DIAGNOSIS — E669 Obesity, unspecified: Secondary | ICD-10-CM

## 2022-07-27 NOTE — Patient Instructions (Signed)
Order- schedule home sleep test dx snoring  Please call us about 2 weeks after your study for results and recommendations

## 2022-07-27 NOTE — Assessment & Plan Note (Signed)
He is a tall and heavyset man.  Some weight loss would probably be beneficial.

## 2022-07-27 NOTE — Assessment & Plan Note (Signed)
History and exam suggest probable obstructive sleep apnea.  Appropriate discussion and education done. Plan-schedule sleep study.  Initially anticipate CPAP would probably be best therapy if needed.

## 2022-08-14 DIAGNOSIS — M546 Pain in thoracic spine: Secondary | ICD-10-CM | POA: Diagnosis not present

## 2022-08-14 DIAGNOSIS — M791 Myalgia, unspecified site: Secondary | ICD-10-CM | POA: Diagnosis not present

## 2022-08-29 DIAGNOSIS — M546 Pain in thoracic spine: Secondary | ICD-10-CM | POA: Diagnosis not present

## 2022-08-29 DIAGNOSIS — S29012D Strain of muscle and tendon of back wall of thorax, subsequent encounter: Secondary | ICD-10-CM | POA: Diagnosis not present

## 2022-09-01 DIAGNOSIS — M546 Pain in thoracic spine: Secondary | ICD-10-CM | POA: Diagnosis not present

## 2022-09-01 DIAGNOSIS — S29012D Strain of muscle and tendon of back wall of thorax, subsequent encounter: Secondary | ICD-10-CM | POA: Diagnosis not present

## 2022-09-03 DIAGNOSIS — M546 Pain in thoracic spine: Secondary | ICD-10-CM | POA: Diagnosis not present

## 2022-09-03 DIAGNOSIS — S29012D Strain of muscle and tendon of back wall of thorax, subsequent encounter: Secondary | ICD-10-CM | POA: Diagnosis not present

## 2022-09-12 DIAGNOSIS — S29012D Strain of muscle and tendon of back wall of thorax, subsequent encounter: Secondary | ICD-10-CM | POA: Diagnosis not present

## 2022-09-12 DIAGNOSIS — M546 Pain in thoracic spine: Secondary | ICD-10-CM | POA: Diagnosis not present

## 2022-09-13 ENCOUNTER — Telehealth: Payer: Self-pay | Admitting: Internal Medicine

## 2022-09-14 NOTE — Telephone Encounter (Addendum)
Pt's order was placed mid Sept - spoke to pt's wife and made her aware we are scheduling about 12 weeks out right now.  She can call me in a few weeks if she hasn't heard from me.  Gave her my direct #.  Nothing further needed.

## 2022-09-18 DIAGNOSIS — M546 Pain in thoracic spine: Secondary | ICD-10-CM | POA: Diagnosis not present

## 2022-09-18 DIAGNOSIS — S29012D Strain of muscle and tendon of back wall of thorax, subsequent encounter: Secondary | ICD-10-CM | POA: Diagnosis not present

## 2022-09-23 ENCOUNTER — Other Ambulatory Visit: Payer: Self-pay | Admitting: Family Medicine

## 2022-09-25 DIAGNOSIS — M546 Pain in thoracic spine: Secondary | ICD-10-CM | POA: Diagnosis not present

## 2022-09-25 DIAGNOSIS — S29012D Strain of muscle and tendon of back wall of thorax, subsequent encounter: Secondary | ICD-10-CM | POA: Diagnosis not present

## 2022-09-25 NOTE — Telephone Encounter (Signed)
Sertraline Last filled:  08/26/22, #30 Last OV:  06/08/22, CPE Next OV:  06/14/23, CPE

## 2022-10-23 DIAGNOSIS — M791 Myalgia, unspecified site: Secondary | ICD-10-CM | POA: Diagnosis not present

## 2022-10-23 DIAGNOSIS — M546 Pain in thoracic spine: Secondary | ICD-10-CM | POA: Diagnosis not present

## 2022-10-25 ENCOUNTER — Ambulatory Visit: Payer: Medicare Other

## 2022-10-25 DIAGNOSIS — R0683 Snoring: Secondary | ICD-10-CM

## 2022-10-25 DIAGNOSIS — G4733 Obstructive sleep apnea (adult) (pediatric): Secondary | ICD-10-CM | POA: Diagnosis not present

## 2022-10-26 DIAGNOSIS — G4733 Obstructive sleep apnea (adult) (pediatric): Secondary | ICD-10-CM | POA: Diagnosis not present

## 2022-11-13 ENCOUNTER — Telehealth: Payer: Self-pay | Admitting: Internal Medicine

## 2022-11-13 NOTE — Telephone Encounter (Signed)
Called and spoke to patients wife and told her Dr Bertrum Sol suggestions. She verbalized understanding and stated that was fine to wait till follow up to go over solutions. Nothing further needed

## 2022-11-13 NOTE — Telephone Encounter (Signed)
He has mild sleep apnea, averaging 11 apneas/ hour. Since he has an appointment coming up soon, I suggest we wait for that appointment to discuss options and see what he wants to do.

## 2022-11-13 NOTE — Telephone Encounter (Signed)
Called wife and she is asking if her husband is needing a cpap machine. Looks like he just did sleep study last month and has follow up with you on Jan 19th.   Sir please advise

## 2022-11-14 DIAGNOSIS — N401 Enlarged prostate with lower urinary tract symptoms: Secondary | ICD-10-CM | POA: Diagnosis not present

## 2022-11-14 DIAGNOSIS — Z125 Encounter for screening for malignant neoplasm of prostate: Secondary | ICD-10-CM | POA: Diagnosis not present

## 2022-11-14 DIAGNOSIS — R3121 Asymptomatic microscopic hematuria: Secondary | ICD-10-CM | POA: Diagnosis not present

## 2022-11-14 DIAGNOSIS — R3912 Poor urinary stream: Secondary | ICD-10-CM | POA: Diagnosis not present

## 2022-11-20 DIAGNOSIS — K6389 Other specified diseases of intestine: Secondary | ICD-10-CM | POA: Diagnosis not present

## 2022-11-20 DIAGNOSIS — N281 Cyst of kidney, acquired: Secondary | ICD-10-CM | POA: Diagnosis not present

## 2022-11-20 DIAGNOSIS — R3121 Asymptomatic microscopic hematuria: Secondary | ICD-10-CM | POA: Diagnosis not present

## 2022-11-26 DIAGNOSIS — R3121 Asymptomatic microscopic hematuria: Secondary | ICD-10-CM | POA: Diagnosis not present

## 2022-11-26 DIAGNOSIS — D414 Neoplasm of uncertain behavior of bladder: Secondary | ICD-10-CM | POA: Diagnosis not present

## 2022-11-26 DIAGNOSIS — R3912 Poor urinary stream: Secondary | ICD-10-CM | POA: Diagnosis not present

## 2022-11-26 DIAGNOSIS — N401 Enlarged prostate with lower urinary tract symptoms: Secondary | ICD-10-CM | POA: Diagnosis not present

## 2022-11-26 DIAGNOSIS — R82998 Other abnormal findings in urine: Secondary | ICD-10-CM | POA: Diagnosis not present

## 2022-11-27 ENCOUNTER — Other Ambulatory Visit: Payer: Self-pay | Admitting: Urology

## 2022-11-28 NOTE — Progress Notes (Signed)
HPI M Cigar Smoker followed for OSA, complicated by  AAA, GERD, Fatty Liver, Back Pain, hx Covid infection, Dyslipidemia, Obesity,  HST 10/26/22- AHI 11/ hr, desaturation to 82%, body weight 283 lbs  ==============================================================================   07/27/22- 20 yoM Cigar Smoker for sleep evaluation courtesy or Dr Ria Bush with concern of EDS, snoring, witnessed apnea suspecting OSA Medical problem list includes AAA, GERD, Fatty Liver, Back Pain, hx Covid infection, Dyslipidemia, Obesity,  Epworth score-9 Body weight today-283 lbs                           Wife is here Covid vax- Flu vax-declines -----Pt states that he has been told that he snores at night. Pt's spouse states that he has stopped breathing while sleeping and pt also stated that he has said that he didn't sleep that well during the night but when he wakes up, he states he feels fine. -He admits that he falls asleep upon sitting and relaxing as he returns home from work.  No sleep medicines.  1 or 2 cups of coffee.  ENT surgery for tonsils as a child.  Not aware of lung or heart problems.  Denies family history of OSA.    11/30/22-  66 yoM Cigar Smoker followed for OSA, complicated by  AAA, GERD, Fatty Liver, Back Pain, hx Covid infection, Dyslipidemia, Obesity,  HST 10/26/22- AHI 11/ hr, desaturation to 82%, body weight 283 lbs Epworth score-9 Body weight today-287 lbs                          Covid vax-3 Phizer Flu vax-had To discuss treatment options Here with wife. We reviewed sleep study results and treatment considerations. He was leaning toward OAP but she pressed for CPAP.   ROS-see HPI   + = positive Constitutional:    weight loss, night sweats, fevers, chills, fatigue, lassitude. HEENT:    headaches, difficulty swallowing, tooth/dental problems, sore throat,       sneezing, itching, ear ache, nasal congestion, post nasal drip, snoring CV:    chest pain, orthopnea, PND, swelling  in lower extremities, anasarca,                                   dizziness, palpitations Resp:   shortness of breath with exertion or at rest.                productive cough,   non-productive cough, coughing up of blood.              change in color of mucus.  wheezing.   Skin:    rash or lesions. GI:  No-   heartburn, +indigestion, abdominal pain, nausea, vomiting, diarrhea,                 change in bowel habits, loss of appetite GU: dysuria, change in color of urine, no urgency or frequency.   flank pain. MS:   joint pain, stiffness, decreased range of motion, back pain. Neuro-     nothing unusual Psych:  change in mood or affect.  depression or anxiety.   memory loss.  OBJ- Physical Exam General- Alert, Oriented, Affect-appropriate, Distress- none acute, +big man Skin- rash-none, lesions- none, excoriation- none Lymphadenopathy- none Head- atraumatic            Eyes- Gross vision intact, PERRLA, conjunctivae  and secretions clear            Ears- Hearing, canals-normal            Nose- Clear, no-Septal dev, mucus, polyps, erosion, perforation             Throat- Mallampati III-IV , mucosa clear , drainage- none, tonsils absent, +teeth Neck- flexible , trachea midline, no stridor , thyroid nl, carotid no bruit Chest - symmetrical excursion , unlabored           Heart/CV- RRR , no murmur , no gallop  , no rub, nl s1 s2                           - JVD- none , edema- none, stasis changes- none, varices- none           Lung- clear to P&A, wheeze- none, cough- none , dullness-none, rub- none           Chest wall-  Abd-  Br/ Gen/ Rectal- Not done, not indicated Extrem- cyanosis- none, clubbing, none, atrophy- none, strength- nl Neuro- grossly intact to observation

## 2022-11-29 NOTE — Patient Instructions (Signed)
DUE TO COVID-19 ONLY TWO VISITORS  (aged 69 and older)  ARE ALLOWED TO COME WITH YOU AND STAY IN THE WAITING ROOM ONLY DURING PRE OP AND PROCEDURE.   **NO VISITORS ARE ALLOWED IN THE SHORT STAY AREA OR RECOVERY ROOM!!**  IF YOU WILL BE ADMITTED INTO THE HOSPITAL YOU ARE ALLOWED ONLY FOUR SUPPORT PEOPLE DURING VISITATION HOURS ONLY (7 AM -8PM)   The support person(s) must pass our screening, gel in and out, and wear a mask at all times, including in the patient's room. Patients must also wear a mask when staff or their support person are in the room. Visitors GUEST BADGE MUST BE WORN VISIBLY  One adult visitor may remain with you overnight and MUST be in the room by 8 P.M.     Your procedure is scheduled on: 12/03/22   Report to [facility] Main Entrance    Report to admitting at: 11:45 AM   Call this number if you have problems the morning of surgery [phone]   Do not eat food :After Midnight.   After Midnight you may have the following liquids until : 11:00 AM DAY OF SURGERY  Water Black Coffee (sugar ok, NO MILK/CREAM OR CREAMERS)  Tea (sugar ok, NO MILK/CREAM OR CREAMERS) regular and decaf                             Plain Jell-O (NO RED)                                           Fruit ices (not with fruit pulp, NO RED)                                     Popsicles (NO RED)                                                                  Juice: apple, WHITE grape, WHITE cranberry Sports drinks like Gatorade (NO RED)               Oral Hygiene is also important to reduce your risk of infection.                                    Remember - BRUSH YOUR TEETH THE MORNING OF SURGERY WITH YOUR REGULAR TOOTHPASTE  DENTURES WILL BE REMOVED PRIOR TO SURGERY PLEASE DO NOT APPLY "Poly grip" OR ADHESIVES!!!   Do NOT smoke after Midnight   Take these medicines the morning of surgery with A SIP OF WATER: sertraline,omeprazole.Tylenol as needed.                               You may not have any metal on your body including hair pins, jewelry, and body piercing             Do not wear lotions, powders, perfumes/cologne, or deodorant  Men may shave face and neck.   Do not bring valuables to the hospital. [place].   Contacts, glasses, or bridgework may not be worn into surgery.   Bring small overnight bag day of surgery.   DO NOT [place]. PHARMACY WILL DISPENSE MEDICATIONS LISTED ON YOUR MEDICATION LIST TO YOU DURING YOUR ADMISSION [place]!    Patients discharged on the day of surgery will not be allowed to drive home.  Someone NEEDS to stay with you for the first 24 hours after anesthesia.   Special Instructions: Bring a copy of your healthcare power of attorney and living will documents         the day of surgery if you haven't scanned them before.              Please read over the following fact sheets you were given: IF YOU HAVE QUESTIONS ABOUT YOUR PRE-OP INSTRUCTIONS PLEASE CALL [phone]    [facility] - Preparing for Surgery Before surgery, you can play an important role.  Because skin is not sterile, your skin needs to be as free of germs as possible.  You can reduce the number of germs on your skin by washing with CHG (chlorahexidine gluconate) soap before surgery.  CHG is an antiseptic cleaner which kills germs and bonds with the skin to continue killing germs even after washing. Please DO NOT use if you have an allergy to CHG or antibacterial soaps.  If your skin becomes reddened/irritated stop using the CHG and inform your nurse when you arrive at Short Stay. Do not shave (including legs and underarms) for at least 48 hours prior to the first CHG shower.  You may shave your face/neck. Please follow these instructions carefully:  1.  Shower with CHG Soap the night before surgery and the  morning of Surgery.  2.  If you choose to wash your  hair, wash your hair first as usual with your  normal  shampoo.  3.  After you shampoo, rinse your hair and body thoroughly to remove the  shampoo.                           4.  Use CHG as you would any other liquid soap.  You can apply chg directly  to the skin and wash                       Gently with a scrungie or clean washcloth.  5.  Apply the CHG Soap to your body ONLY FROM THE NECK DOWN.   Do not use on face/ open                           Wound or open sores. Avoid contact with eyes, ears mouth and genitals (private parts).                       Wash face,  Genitals (private parts) with your normal soap.             6.  Wash thoroughly, paying special attention to the area where your surgery  will be performed.  7.  Thoroughly rinse your body with warm water from the neck down.  8.  DO  NOT shower/wash with your normal soap after using and rinsing off  the CHG Soap.                9.  Pat yourself dry with a clean towel.            10.  Wear clean pajamas.            11.  Place clean sheets on your bed the night of your first shower and do not  sleep with pets. Day of Surgery : Do not apply any lotions/deodorants the morning of surgery.  Please wear clean clothes to the hospital/surgery center.  FAILURE TO FOLLOW THESE INSTRUCTIONS MAY RESULT IN THE CANCELLATION OF YOUR SURGERY PATIENT SIGNATURE_________________________________  NURSE SIGNATURE__________________________________  ________________________________________________________________________

## 2022-11-30 ENCOUNTER — Other Ambulatory Visit: Payer: Self-pay

## 2022-11-30 ENCOUNTER — Encounter: Payer: Self-pay | Admitting: Internal Medicine

## 2022-11-30 ENCOUNTER — Ambulatory Visit (INDEPENDENT_AMBULATORY_CARE_PROVIDER_SITE_OTHER): Payer: Self-pay | Admitting: Internal Medicine

## 2022-11-30 ENCOUNTER — Encounter (HOSPITAL_COMMUNITY)
Admission: RE | Admit: 2022-11-30 | Discharge: 2022-11-30 | Disposition: A | Discharge status: Home / Self Care | Payer: Medicare Other | Source: Ambulatory Visit | Attending: Urology | Admitting: Urology

## 2022-11-30 ENCOUNTER — Encounter (HOSPITAL_COMMUNITY): Payer: Self-pay

## 2022-11-30 VITALS — BP 124/72 | HR 103 | Ht 75.0 in | Wt 287.8 lb

## 2022-11-30 VITALS — BP 150/76 | HR 72 | Temp 97.8°F | Ht 75.0 in | Wt 287.0 lb

## 2022-11-30 DIAGNOSIS — I7143 Infrarenal abdominal aortic aneurysm, without rupture: Secondary | ICD-10-CM | POA: Insufficient documentation

## 2022-11-30 DIAGNOSIS — R7303 Prediabetes: Secondary | ICD-10-CM

## 2022-11-30 DIAGNOSIS — Z01818 Encounter for other preprocedural examination: Secondary | ICD-10-CM | POA: Insufficient documentation

## 2022-11-30 DIAGNOSIS — E669 Obesity, unspecified: Secondary | ICD-10-CM

## 2022-11-30 DIAGNOSIS — G4733 Obstructive sleep apnea (adult) (pediatric): Secondary | ICD-10-CM

## 2022-11-30 HISTORY — DX: Sleep apnea, unspecified: G47.30

## 2022-11-30 HISTORY — DX: Anxiety disorder, unspecified: F41.9

## 2022-11-30 HISTORY — DX: Prediabetes: R73.03

## 2022-11-30 LAB — CBC
HCT: 46.1 % (ref 39.0–52.0)
Hemoglobin: 15.2 g/dL (ref 13.0–17.0)
MCH: 30.3 pg (ref 26.0–34.0)
MCHC: 33 g/dL (ref 30.0–36.0)
MCV: 91.8 fL (ref 80.0–100.0)
Platelets: 175 10*3/uL (ref 150–400)
RBC: 5.02 MIL/uL (ref 4.22–5.81)
RDW: 13.7 % (ref 11.5–15.5)
WBC: 6.3 10*3/uL (ref 4.0–10.5)
nRBC: 0 % (ref 0.0–0.2)

## 2022-11-30 LAB — BASIC METABOLIC PANEL
Anion gap: 10 (ref 5–15)
BUN: 22 mg/dL (ref 8–23)
CO2: 25 mmol/L (ref 22–32)
Calcium: 9.2 mg/dL (ref 8.9–10.3)
Chloride: 101 mmol/L (ref 98–111)
Creatinine, Ser: 0.87 mg/dL (ref 0.61–1.24)
GFR, Estimated: >60 mL/min (ref >60)
Glucose, Bld: 106 mg/dL — ABNORMAL HIGH (ref 70–99)
Potassium: 4.2 mmol/L (ref 3.5–5.1)
Sodium: 136 mmol/L (ref 135–145)

## 2022-11-30 LAB — HEMOGLOBIN A1C
Hgb A1c MFr Bld: 6.1 % — ABNORMAL HIGH (ref 4.8–5.6)
Mean Plasma Glucose: 128.37 mg/dL

## 2022-11-30 LAB — GLUCOSE, CAPILLARY: Glucose-Capillary: 113 mg/dL — ABNORMAL HIGH (ref 70–99)

## 2022-11-30 NOTE — Assessment & Plan Note (Signed)
Avoid weight gain and strive for normal body weight to aid management of OSA

## 2022-11-30 NOTE — Patient Instructions (Signed)
Order- new DME, new CPAP auto 5-15, mask of choice, humidifier, supplies, please install AirView  Please call if we can help

## 2022-11-30 NOTE — Progress Notes (Addendum)
For Short Stay: [place] appointment date:  Bowel Prep reminder:   For Anesthesia: PCP - Der. Ria Bush Cardiologist - N/A  Chest x-ray -  EKG -  Stress Test -  ECHO -  Cardiac Cath -  Pacemaker/ICD device last checked: Pacemaker orders received: Device Rep notified:  Spinal Cord Stimulator:  Sleep Study - Yes CPAP - NO  Fasting Blood Sugar - N/A Checks Blood Sugar _____ times a day Date and result of last Hgb A1c- 6.2: 06/08/22  Last dose of GLP1 agonist-  GLP1 instructions:   Last dose of SGLT-2 inhibitors-  SGLT-2 instructions:   Blood Thinner Instructions: Aspirin Instructions: Last Dose:  Activity level: Can go up a flight of stairs and activities of daily living without stopping and without chest pain and/or shortness of breath   Able to exercise without chest pain and/or shortness of breath  Anesthesia review: Hx: AAA,Smoker,Pre-DIA  Patient denies shortness of breath, fever, cough and chest pain at PAT appointment   Patient verbalized understanding of instructions that were given to them at the PAT appointment. Patient was also instructed that they will need to review over the PAT instructions again at home before surgery.

## 2022-11-30 NOTE — Assessment & Plan Note (Signed)
OSA Plan- new CPAP autopap 5-15

## 2022-12-03 ENCOUNTER — Encounter (HOSPITAL_COMMUNITY): Payer: Self-pay | Admitting: Urology

## 2022-12-03 ENCOUNTER — Other Ambulatory Visit: Payer: Self-pay

## 2022-12-03 ENCOUNTER — Ambulatory Visit (HOSPITAL_BASED_OUTPATIENT_CLINIC_OR_DEPARTMENT_OTHER): Payer: Medicare Other | Admitting: Registered Nurse

## 2022-12-03 ENCOUNTER — Ambulatory Visit (HOSPITAL_COMMUNITY): Payer: Medicare Other | Admitting: Registered Nurse

## 2022-12-03 ENCOUNTER — Encounter (HOSPITAL_COMMUNITY)
Admission: RE | Disposition: A | Discharge status: Home / Self Care | Payer: Self-pay | Source: Home / Self Care | Attending: Urology

## 2022-12-03 ENCOUNTER — Ambulatory Visit (HOSPITAL_COMMUNITY)
Admission: RE | Admit: 2022-12-03 | Discharge: 2022-12-03 | Disposition: A | Discharge status: Home / Self Care | Payer: Medicare Other | Attending: Urology | Admitting: Urology

## 2022-12-03 DIAGNOSIS — N3289 Other specified disorders of bladder: Secondary | ICD-10-CM

## 2022-12-03 DIAGNOSIS — D303 Benign neoplasm of bladder: Secondary | ICD-10-CM | POA: Diagnosis not present

## 2022-12-03 DIAGNOSIS — F419 Anxiety disorder, unspecified: Secondary | ICD-10-CM | POA: Diagnosis not present

## 2022-12-03 DIAGNOSIS — I714 Abdominal aortic aneurysm, without rupture, unspecified: Secondary | ICD-10-CM | POA: Diagnosis not present

## 2022-12-03 DIAGNOSIS — E669 Obesity, unspecified: Secondary | ICD-10-CM | POA: Diagnosis not present

## 2022-12-03 DIAGNOSIS — F17210 Nicotine dependence, cigarettes, uncomplicated: Secondary | ICD-10-CM | POA: Diagnosis not present

## 2022-12-03 DIAGNOSIS — R338 Other retention of urine: Secondary | ICD-10-CM | POA: Insufficient documentation

## 2022-12-03 DIAGNOSIS — N329 Bladder disorder, unspecified: Secondary | ICD-10-CM | POA: Diagnosis not present

## 2022-12-03 DIAGNOSIS — N281 Cyst of kidney, acquired: Secondary | ICD-10-CM | POA: Insufficient documentation

## 2022-12-03 DIAGNOSIS — Z6835 Body mass index (BMI) 35.0-35.9, adult: Secondary | ICD-10-CM | POA: Insufficient documentation

## 2022-12-03 DIAGNOSIS — M1990 Unspecified osteoarthritis, unspecified site: Secondary | ICD-10-CM

## 2022-12-03 DIAGNOSIS — K219 Gastro-esophageal reflux disease without esophagitis: Secondary | ICD-10-CM | POA: Insufficient documentation

## 2022-12-03 DIAGNOSIS — N401 Enlarged prostate with lower urinary tract symptoms: Secondary | ICD-10-CM | POA: Insufficient documentation

## 2022-12-03 DIAGNOSIS — G4733 Obstructive sleep apnea (adult) (pediatric): Secondary | ICD-10-CM | POA: Insufficient documentation

## 2022-12-03 HISTORY — PX: CYSTOSCOPY WITH BIOPSY: SHX5122

## 2022-12-03 HISTORY — PX: TRANSURETHRAL RESECTION OF BLADDER TUMOR: SHX2575

## 2022-12-03 SURGERY — CYSTOSCOPY, WITH BIOPSY
Anesthesia: General

## 2022-12-03 MED ORDER — SUGAMMADEX SODIUM 500 MG/5ML IV SOLN
INTRAVENOUS | Status: DC | PRN
  Administered 2022-12-03: 400 mg via INTRAVENOUS

## 2022-12-03 MED ORDER — ACETAMINOPHEN 500 MG PO TABS
1000.0000 mg | ORAL_TABLET | Freq: Once | ORAL | Status: AC
  Administered 2022-12-03: 1000 mg via ORAL
  Filled 2022-12-03: qty 2

## 2022-12-03 MED ORDER — SUGAMMADEX SODIUM 500 MG/5ML IV SOLN
INTRAVENOUS | Status: AC
  Filled 2022-12-03: qty 5

## 2022-12-03 MED ORDER — CEFAZOLIN IN SODIUM CHLORIDE 3-0.9 GM/100ML-% IV SOLN
3.0000 g | INTRAVENOUS | Status: AC
  Administered 2022-12-03: 3 g via INTRAVENOUS
  Filled 2022-12-03: qty 100

## 2022-12-03 MED ORDER — PROPOFOL 10 MG/ML IV BOLUS
INTRAVENOUS | Status: AC
  Filled 2022-12-03: qty 20

## 2022-12-03 MED ORDER — FENTANYL CITRATE (PF) 100 MCG/2ML IJ SOLN
INTRAMUSCULAR | Status: AC
  Filled 2022-12-03: qty 2

## 2022-12-03 MED ORDER — DEXAMETHASONE SODIUM PHOSPHATE 10 MG/ML IJ SOLN
INTRAMUSCULAR | Status: DC | PRN
  Administered 2022-12-03: 5 mg via INTRAVENOUS

## 2022-12-03 MED ORDER — LIDOCAINE 2% (20 MG/ML) 5 ML SYRINGE
INTRAMUSCULAR | Status: DC | PRN
  Administered 2022-12-03: 100 mg via INTRAVENOUS

## 2022-12-03 MED ORDER — MIDAZOLAM HCL 2 MG/2ML IJ SOLN
INTRAMUSCULAR | Status: AC
  Filled 2022-12-03: qty 2

## 2022-12-03 MED ORDER — PROPOFOL 10 MG/ML IV BOLUS
INTRAVENOUS | Status: DC | PRN
  Administered 2022-12-03: 200 mg via INTRAVENOUS

## 2022-12-03 MED ORDER — LACTATED RINGERS IV SOLN: INTRAVENOUS | Status: DC

## 2022-12-03 MED ORDER — FENTANYL CITRATE PF 50 MCG/ML IJ SOSY: 25.0000 ug | PREFILLED_SYRINGE | INTRAMUSCULAR | Status: DC | PRN

## 2022-12-03 MED ORDER — PROMETHAZINE HCL 25 MG/ML IJ SOLN: 6.2500 mg | INTRAMUSCULAR | Status: DC | PRN

## 2022-12-03 MED ORDER — MIDAZOLAM HCL 5 MG/5ML IJ SOLN
INTRAMUSCULAR | Status: DC | PRN
  Administered 2022-12-03: 2 mg via INTRAVENOUS

## 2022-12-03 MED ORDER — CHLORHEXIDINE GLUCONATE 0.12 % MT SOLN
15.0000 mL | Freq: Once | OROMUCOSAL | Status: AC
  Administered 2022-12-03: 15 mL via OROMUCOSAL

## 2022-12-03 MED ORDER — OXYCODONE-ACETAMINOPHEN 5-325 MG PO TABS: 1.0000 | ORAL_TABLET | ORAL | 0 refills | Status: DC | PRN

## 2022-12-03 MED ORDER — FENTANYL CITRATE (PF) 100 MCG/2ML IJ SOLN
INTRAMUSCULAR | Status: DC | PRN
  Administered 2022-12-03 (×2): 50 ug via INTRAVENOUS

## 2022-12-03 MED ORDER — SODIUM CHLORIDE 0.9 % IR SOLN
Status: DC | PRN
  Administered 2022-12-03: 3000 mL

## 2022-12-03 MED ORDER — LIDOCAINE HCL (PF) 2 % IJ SOLN
INTRAMUSCULAR | Status: AC
  Filled 2022-12-03: qty 5

## 2022-12-03 MED ORDER — DOCUSATE SODIUM 100 MG PO CAPS: 100.0000 mg | ORAL_CAPSULE | Freq: Every day | ORAL | 0 refills | Status: DC | PRN

## 2022-12-03 MED ORDER — KETOROLAC TROMETHAMINE 30 MG/ML IJ SOLN
INTRAMUSCULAR | Status: DC | PRN
  Administered 2022-12-03: 30 mg via INTRAVENOUS

## 2022-12-03 MED ORDER — 0.9 % SODIUM CHLORIDE (POUR BTL) OPTIME
TOPICAL | Status: DC | PRN
  Administered 2022-12-03: 1000 mL

## 2022-12-03 MED ORDER — CEPHALEXIN 500 MG PO CAPS: 500.0000 mg | ORAL_CAPSULE | Freq: Two times a day (BID) | ORAL | 0 refills | Status: AC

## 2022-12-03 MED ORDER — KETOROLAC TROMETHAMINE 30 MG/ML IJ SOLN
INTRAMUSCULAR | Status: AC
  Filled 2022-12-03: qty 1

## 2022-12-03 MED ORDER — ORAL CARE MOUTH RINSE: 15.0000 mL | Freq: Once | OROMUCOSAL | Status: AC

## 2022-12-03 MED ORDER — AMISULPRIDE (ANTIEMETIC) 5 MG/2ML IV SOLN: 10.0000 mg | Freq: Once | INTRAVENOUS | Status: DC | PRN

## 2022-12-03 MED ORDER — ROCURONIUM BROMIDE 10 MG/ML (PF) SYRINGE
PREFILLED_SYRINGE | INTRAVENOUS | Status: AC
  Filled 2022-12-03: qty 10

## 2022-12-03 MED ORDER — ROCURONIUM BROMIDE 10 MG/ML (PF) SYRINGE
PREFILLED_SYRINGE | INTRAVENOUS | Status: DC | PRN
  Administered 2022-12-03: 70 mg via INTRAVENOUS

## 2022-12-03 MED ORDER — ONDANSETRON HCL 4 MG/2ML IJ SOLN
INTRAMUSCULAR | Status: DC | PRN
  Administered 2022-12-03: 4 mg via INTRAVENOUS

## 2022-12-03 SURGICAL SUPPLY — 20 items
BAG DRN RND TRDRP ANRFLXCHMBR (UROLOGICAL SUPPLIES)
BAG URINE DRAIN 2000ML AR STRL (UROLOGICAL SUPPLIES) IMPLANT
BAG URO CATCHER STRL LF (MISCELLANEOUS) ×2 IMPLANT
CATH URETHERAL OPEN END 7FR (CATHETERS) IMPLANT
DRAPE FOOT SWITCH (DRAPES) ×2 IMPLANT
ELECT REM PT RETURN 15FT ADLT (MISCELLANEOUS) ×2 IMPLANT
EVACUATOR MICROVAS BLADDER (UROLOGICAL SUPPLIES) IMPLANT
GLOVE SURG LX STRL 7.5 STRW (GLOVE) ×2 IMPLANT
GOWN STRL REUS W/ TWL XL LVL3 (GOWN DISPOSABLE) ×2 IMPLANT
GOWN STRL REUS W/TWL XL LVL3 (GOWN DISPOSABLE) ×1
GUIDEWIRE STR DUAL SENSOR (WIRE) IMPLANT
HOLDER FOLEY CATH W/STRAP (MISCELLANEOUS) IMPLANT
KIT TURNOVER KIT A (KITS) IMPLANT
LOOP CUT BIPOLAR 24F LRG (ELECTROSURGICAL) IMPLANT
MANIFOLD NEPTUNE II (INSTRUMENTS) ×2 IMPLANT
PACK CYSTO (CUSTOM PROCEDURE TRAY) ×2 IMPLANT
SYR TOOMEY IRRIG 70ML (MISCELLANEOUS)
SYRINGE TOOMEY IRRIG 70ML (MISCELLANEOUS) IMPLANT
TUBING CONNECTING 10 (TUBING) ×2 IMPLANT
TUBING UROLOGY SET (TUBING) ×2 IMPLANT

## 2022-12-03 NOTE — Op Note (Signed)
Operative Note  Preoperative diagnosis:  1.  Bladder mass  Postoperative diagnosis: 1.  Bladder mass  Procedure(s): 1.  TURBT small  Surgeon: Rexene Alberts, MD  Assistants:  None  Anesthesia:  General  Complications:  None  EBL: Minimal  Specimens: 1.  ID Type Source Tests Collected by Time Destination  1 : Left trigone bladder lesion Tissue PATH GU biopsy SURGICAL PATHOLOGY Janith Lima, MD 12/03/2022 1457   2 : Right trigone bladder lesion Tissue PATH GU biopsy SURGICAL PATHOLOGY Janith Lima, MD 12/03/2022 1459     Drains/Catheters: 1.  None  Intraoperative findings:   Small nodularity approximately 1.5 cm medial to the right ureterovesical junction.  Small papillary lesion in the left trigone sub-centimeter.  This is resected down to the level of the muscle.  Excellent hemostasis obtained.  Number of tumors: 1        Size of largest tumor:      1.5 cm  Characteristics of tumors:     Nodular  Primary: Yes  Suspicious for Carcinoma in situ:   No  Clinical tumor stage:        cTa     Bimanual exam under anesthesia:        Performed: No evidence of 3-dimensional mass  Visually complete resection:               Yes  Visualization of detrusor muscle in resection base:    Yes  Visual evaluation for perforation:    Performed: No evidence of perforation.   Indication:  Brian Villanueva is a 67 y.o. male with the history of microscopic hematuria.  He does have significant smoking history.  CT hematuria protocol 11/2021 with asymmetric bladder wall thickening in the right trigone just medial to the right ureterovesical junction.  Cystoscopy 11/16/2022 with nodularity in that area.  Cytology resulted atypical.  All the risks, benefits were discussed with the patient to include but not limited to infection, pain, bleeding, damage to adjacent structures, need for further operations, adverse reaction to anesthesia and death.  Patient understands these risks and agrees to proceed  with the operation as planned.    Description of procedure: After informed consent was obtained from the patient, the patient was taken to the operating room. General anesthesia was administered. The patient was placed in dorsal lithotomy position and prepped and draped in usual sterile fashion. Sequential compression devices were applied to lower extremities at the beginning of the case for DVT prophylaxis. Antibiotics were infused prior to surgery start time. A surgical time-out was performed to properly identify the patient, the surgery to be performed, and the surgical site.     We then passed the 21-French rigid cystoscope down the urethra and into the bladder under direct vision without any difficulty. The anterior urethral was normal. The prostate was non-obstructing. The bladder was inspected with 30 and 70 degree lenses. Once in the bladder, systematic evaluation of bladder revealed approximate 1.5 cm nodularity in the trigone just medial to the right ureteral orifice as well as sub-centimeter papillary lesion on the right trigone. The ureteral orfices were in orthotopic position and not involved.   We then removed the cystoscope and then passed down the 26 French resectoscope sheath down the urethra into the bladder under direct vision with the visual obturator. The tumor was resected down to muscle. The TUR bladder tumor chips were retrieved from the bladder and each region of resection was passed off the field as a diagnosis  separate specimen.  Hemostasis was achieved using electrocautery. We then proceeded with removing the resectoscope.    The patient tolerated the procedure well with no complication and was awoken from anesthesia and taken to recovery in stable condition.     Matt R. [facility]  Pager: [phone]

## 2022-12-03 NOTE — H&P (Signed)
Office Visit Report     11/26/2022   --------------------------------------------------------------------------------   Dedra Skeens  MRN: [id number]  DOB: 1956/02/17, 67 year old Male  SSN:    PRIMARY CARE:  Ria Bush, MD  PRIMARY CARE FAX:  [phone]  REFERRING:  Janith Lima, MD  PROVIDER:  Rexene Alberts, M.D.  LOCATION:  [facility] [phone]     --------------------------------------------------------------------------------   CC/HPI: Brian Villanueva is a 67 year old male who is seen in followup today for microscopic hematuria, bladder lesion, BPH/LUTS and prostate cancer screening.   1. Microscopic hematuria:  He was found to have microscopic hematuria on urinalysis in 05/2022. He denies episodes of gross hematuria. He does have a significant smoking history has smoked for over 30 years. He reports that this is rare and intermittent smoking only a few cigarettes at a time. He denies a family history of urologic malignancy.  -He does report history of urolithiasis and was treated with ESWL and ureteroscopy with laser lithotripsy approximately 30 years ago. He denies any recurrence of stone episodes.  -Of note, MRI abdomen 12/10/2021 demonstrated left renal cyst, benign, Bosniak 1 and 2.  -CT hematuria protocol 11/20/2021 with asymmetric bladder wall thickening in the right just medial to the right ureterovesical junction with a mucosal lesion/mass in this area cannot be excluded.  -Cystoscopy 11/26/2022 with nodularity medial to the right ureterovesical junction. No papillary lesions seen. No concerning lesions of CIS.   #2. Bladder lesion: He developed microscopic hematuria as above. CT hematuria protocol and cystoscopy demonstrated nodular lesion approximately 1 x 1 cm just medial to the right vesicle junction. Patient denies gross hematuria.   #3. Hematospermia:  -He reports that over the course of the past year, he is noticed brownish discolored semen.  He reports this is nonpainful. He denies history of urinary tract infections or prostatitis. He denies painful ejaculation. This has resolved.   4. Prostate cancer screening: His most recent PSA was 0.3 on 11/14/2022. He denies taking 5 alpha reductase inhibitors. He denies a family history of prostate cancer.   #5. BPH/LUTS: He states that the past several years, he has noticed worsening lower urinary tract symptoms that he describes as weaker flow of stream, sensation of incomplete bladder emptying. He began tamsulosin in 11/2022 has seen improvement.   6. Simple left renal cyst: MRI 11/2021 revealed simple left renal cyst, benign, Bosniak 1-2 of which no further independent follow-up imaging is required.   Patient currently denies fever, chills, sweats, nausea, vomiting, abdominal or flank pain, gross hematuria or dysuria.   He has a past medical history of obesity, hyperlipidemia, prediabetes, AAA, chronic thoracic back pain, obstructive sleep apnea.   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     ALLERGIES: No Known Drug Allergies    MEDICATIONS: Omeprazole 40 mg capsule,delayed release  Tamsulosin Hcl 0.4 mg capsule 1 capsule PO Q HS  Centrum Silver Men  Cyclobenzaprine Hcl 10 mg tablet  Sertraline Hcl 25 mg tablet  Vitamin C  Vitamin D3  Zinc     GU PSH: Locm 300-'399Mg'$ /Ml Iodine,1Ml - 11/20/2022       PSH Notes: Spinal Fusion - 1970  Knee Surgery - 1971, 1972   NON-GU PSH: None   GU PMH: Microscopic hematuria - 11/20/2022, - 11/14/2022 BPH w/LUTS - 11/14/2022 Encounter for Prostate Cancer screening - 11/14/2022 Weak Urinary Stream - 11/14/2022    NON-GU PMH: Anxiety Arthritis GERD Hypercholesterolemia Sleep Apnea    FAMILY HISTORY:  2 daughters - Runs in Family 1 son - Runs in [employer] Sleep in Family throat cancer - Runs in Family   SOCIAL HISTORY: Marital Status: Married Ethnicity: Not Hispanic Or Latino; Race: White Current Smoking Status: Patient  smokes occasionally.   Tobacco Use Assessment Completed: Used Tobacco in last 30 days? Drinks 4+ caffeinated drinks per day.    REVIEW OF SYSTEMS:    GU Review Male:   Patient denies frequent urination, hard to postpone urination, burning/ pain with urination, get up at night to urinate, leakage of urine, stream starts and stops, trouble starting your stream, have to strain to urinate , erection problems, and penile pain.  Gastrointestinal (Upper):   Patient denies nausea, vomiting, and indigestion/ heartburn.  Gastrointestinal (Lower):   Patient denies diarrhea and constipation.  Constitutional:   Patient denies fever, night sweats, weight loss, and fatigue.  Skin:   Patient denies itching and skin rash/ lesion.  Eyes:   Patient denies blurred vision and double vision.  Ears/ Nose/ Throat:   Patient denies sore throat and sinus problems.  Hematologic/Lymphatic:   Patient denies swollen glands and easy bruising.  Cardiovascular:   Patient denies leg swelling and chest pains.  Respiratory:   Patient denies cough and shortness of breath.  Endocrine:   Patient denies excessive thirst.  Musculoskeletal:   Patient denies back pain and joint pain.  Neurological:   Patient denies headaches and dizziness.  Psychologic:   Patient denies depression and anxiety.   VITAL SIGNS: None   MULTI-SYSTEM PHYSICAL EXAMINATION:    Constitutional: Well-nourished. No physical deformities. Normally developed. Good grooming.  Respiratory: No labored breathing, no use of accessory muscles.   Cardiovascular: Normal temperature, normal extremity pulses, no swelling, no varicosities.  Gastrointestinal: No mass, no tenderness, no rigidity, non obese abdomen.     Complexity of Data:  Source Of History:  Patient, Medical Record Summary  Lab Test Review:   PSA  Records Review:   AUA Symptom Score, Previous Doctor Records, Previous Patient Records  Urine Test Review:   Urinalysis  Urodynamics Review:   Review  Bladder Scan  X-Ray Review: C.T. Hematuria: Reviewed Films. Reviewed Report. Discussed With Patient.     11/14/22  PSA  Total PSA 0.30 ng/mL   Notes:                     CLINICAL DATA: Microhematuria initially discovered 6 months ago.  History of renal calculi and lithotripsy.   EXAM:  CT ABDOMEN AND PELVIS WITHOUT AND WITH CONTRAST   TECHNIQUE:  Multidetector CT imaging of the abdomen and pelvis was performed  following the standard protocol before and following the bolus  administration of intravenous contrast.   RADIATION DOSE REDUCTION: This exam was performed according to the  departmental dose-optimization program which includes automated  exposure control, adjustment of the mA and/or kV according to  patient size and/or use of iterative reconstruction technique.   CONTRAST: 125 cc Omnipaque 300   COMPARISON: Abdominal MRI 12/08/2021. Abdominal ultrasound  11/17/2021. No previous relevant CT.   FINDINGS:  Lower chest: Clear lung bases. No significant pleural or pericardial  effusion. Mild aortic and coronary artery atherosclerosis. The heart  is mildly enlarged.   Hepatobiliary: The liver is normal in density without suspicious  focal abnormality. No evidence of gallstones, gallbladder wall  thickening or biliary dilatation.   Pancreas: Unremarkable. No pancreatic ductal dilatation or  surrounding inflammatory changes.   Spleen: Normal in size  without focal abnormality.   Adrenals/Urinary Tract: Both adrenal glands appear normal.  Pre-contrast images demonstrate no renal, ureteral or bladder  calculi. Post-contrast, both kidneys enhance normally. There is no  evidence of enhancing renal mass. Hyperdense cyst projecting  posteriorly from the mid left kidney measures 2.6 x 2.1 cm on image  43/5 and demonstrates no enhancement following contrast. There is a  simple left renal parapelvic cyst measuring 5.0 cm on image 49/5.  These lesions are similar to previous MRI  delayed images result in  segmental visualization of the ureters. No focal upper tract  urothelial abnormalities are identified. There is incomplete  distention of the bladder. Asymmetric bladder wall thickening on the  right just medial to the ureterovesical junction is noted without  definite abnormal enhancement. A mucosal lesion in this area cannot  be excluded. No other bladder abnormalities are identified.   Stomach/Bowel: No enteric contrast administered. The stomach appears  unremarkable for its degree of distension. No evidence of bowel wall  thickening, distention or surrounding inflammatory change. The  appendix appears normal. Mild distal colonic diverticulosis.   Vascular/Lymphatic: There are no enlarged abdominal or pelvic lymph  nodes. There are small lymph nodes in the porta hepatis and  retroperitoneum which are similar to previous MRI. Mild aortic and  branch vessel atherosclerosis without evidence of aneurysm or large  vessel occlusion.   Reproductive: The prostate gland and seminal vesicles appear  unremarkable.   Other: No evidence of abdominal wall mass or hernia. No ascites.   Musculoskeletal: No acute or significant osseous findings.  Transitional lumbosacral anatomy. Mild spondylosis. The sacroiliac  joints are chronically ankylosed.   IMPRESSION:  1. Asymmetric bladder wall thickening on the right just medial to  the ureterovesical junction. A mucosal lesion/mass in this area  cannot be excluded. Recommend further evaluation with cystoscopy.  2. No other explanation for hematuria identified. No evidence of  urinary tract calculus, enhancing renal mass or upper tract  urothelial lesion.  3. Hyperdense left renal cyst and simple left renal parapelvic cyst,  similar to previous MRI. These lesions do not require any specific  imaging follow-up.  4. Aortic Atherosclerosis (ICD10-I70.0).  5. These results will be called to the ordering clinician or   representative by the Radiologist Assistant, and communication  documented in the PACS or [employer].    Electronically Signed  By: Richardean Sale M.D.  On: 11/20/2022 16:32     PROCEDURES:         Flexible Cystoscopy - 52000  Risks, benefits, and some of the potential complications of the procedure were discussed at length with the patient including infection, bleeding, voiding discomfort, urinary retention, fever, chills, sepsis, and others. All questions were answered. Informed consent was obtained. Antibiotic prophylaxis was given. Sterile technique and intraurethral analgesia were used.  Meatus:  Normal size. Normal location. Normal condition.  Urethra:  No strictures.  External Sphincter:  Normal.  Verumontanum:  Normal.  Prostate:  Obstructing. Moderate hyperplasia.  Bladder Neck:  Non-obstructing.  Ureteral Orifices:  Normal location. Normal size. Normal shape. Effluxed clear urine.  Bladder:  No trabeculation. There is approximate 1 x 1 cm area of nodularity just medial to the right ureterovesical junction. No papillary lesion identified.       The lower urinary tract was carefully examined. The procedure was well-tolerated and without complications. Antibiotic instructions were given. Instructions were given to call the office immediately for bloody urine, difficulty urinating, urinary retention, painful or frequent urination, fever, chills,  nausea, vomiting or other illness. The patient stated that he understood these instructions and would comply with them.         Urinalysis w/Scope Dipstick Dipstick Cont'd Micro  Color: Yellow Bilirubin: Neg mg/dL WBC/hpf: 0 - 5/hpf  Appearance: Slightly Cloudy Ketones: Neg mg/dL RBC/hpf: NS (Not Seen)  Specific Gravity: 1.025 Blood: Neg ery/uL Bacteria: Rare (0-9/hpf)  pH: 5.5 Protein: 1+ mg/dL Cystals: NS (Not Seen)  Glucose: Neg mg/dL Urobilinogen: 0.2 mg/dL Casts: Hyaline    Nitrites: Neg Trichomonas: Not Present     Leukocyte Esterase: Trace leu/uL Mucous: Present      Epithelial Cells: NS (Not Seen)      Yeast: NS (Not Seen)      Sperm: Not Present    ASSESSMENT:      ICD-10 Details  1 GU:   BPH w/LUTS - N40.1   2   Weak Urinary Stream - R39.12   3   Bladder tumor/neoplasm - D41.4   4   Microscopic hematuria - R31.21    PLAN:           Document Letter(s):  Created for Patient: Clinical Summary         Notes:   1. Microscopic hematuria: Revealed bladder lesion as below. Will arrange for bladder biopsy.   #2. Bladder lesion: Seen on CT hematuria protocol and cystoscopy today with nodularity just to the right ureteral vesicle junction. We discussed that this is most likely benign. There is no papillary lesion. Will send urine for cytology. I do recommend cystoscopy, bladder biopsy, possible TURBT. Discussed risk and benefits. Surgery letter sent.   Risks and benefits of Transurethral Resection of Bladder Tumor were reviewed in detail including infection, bleeding, blood transfusion, injury to bladder/urethra/surrounding structures, bladder perforation, obstructive and irritative voiding symptoms, and global anesthesia risks including but not limited to CVA, MI, DVT, PE, pneumonia, and death. Patient expressed understanding and desire to proceed.    3. Prostate cancer screening: DRE 50 g, no nodules. PSA was 0.3 in 11/2022. Recommend annual PSA evaluation.   #4. BPH/LUTS: Continue tamsulosin 0.4 mg daily.   #5. Simple left renal cyst: Reviewed MRI in 11/2021 that revealed simple renal cyst, Bosniak 1-2. We discussed these are benign and that no further independent imaging is required.   CC: Ria Bush, MD    Urology Preoperative H&P   Chief Complaint: Bladder lesion here for bladder biopsy, possible TURBT  History of Present Illness: Brian Villanueva is a 67 y.o. male with bladder lesion here for bladder biopsy, possible TURBT. Denies fevers, chills, dysuria.    Past Medical History:   Diagnosis Date   AAA (abdominal aortic aneurysm) ([place]) 11/20/2021   Abd [place] 11/2021 - 3.6cm infrarenal AAA - rec rpt [place] 2 yrs   Anxiety    Arthritis    COVID-19 virus infection 03/17/2021   Dyslipidemia 03/13/2020   GERD (gastroesophageal reflux disease)    Obesity, Class II, BMI 35-39.9, no comorbidity 02/22/2020   Pre-diabetes    Sleep apnea     Past Surgical History:  Procedure Laterality Date   AUTOGRAFT BONE SPINE     remote lumbar spine fusion with autograft   COLONOSCOPY  03/2020   HP, rpt 10 yrs ([place])   kidney stone removal     20+ years ago    KNEE SURGERY Left    x2 growing up - ligament tears   TONSILLECTOMY     WISDOM TOOTH EXTRACTION      Allergies: No Known  Allergies  Family History  Problem Relation Age of Onset   Esophageal cancer Mother    High Cholesterol Father    High blood pressure Father    Atrial fibrillation Father    Arthritis Maternal Grandfather    Cancer Paternal Grandmother        unsure   Colon polyps Neg Hx    Colon cancer Neg Hx    Rectal cancer Neg Hx    Stomach cancer Neg Hx     Social History:  reports that he has been smoking cigars. He has been exposed to tobacco smoke. He has never used smokeless tobacco. He reports current alcohol use. He reports that he does not use drugs.  ROS: A complete review of systems was performed.  All systems are negative except for pertinent findings as noted.  Physical Exam:  Vital signs in last 24 hours: Temp:  [97.7 F (36.5 C)] 97.7 F (36.5 C) (01/22 1209) Pulse Rate:  [76] 76 (01/22 1209) Resp:  [18] 18 (01/22 1209) BP: (160)/(76) 160/76 (01/22 1209) SpO2:  [96 %] 96 % (01/22 1209) Weight:  [130.5 kg] 130.5 kg (01/22 1218) Constitutional:  Alert and oriented, No acute distress Cardiovascular: Regular rate and rhythm Respiratory: Normal respiratory effort, Lungs clear bilaterally GI: Abdomen is soft, nontender, nondistended, no abdominal masses GU: No CVA tenderness Lymphatic:  No lymphadenopathy Neurologic: Grossly intact, no focal deficits Psychiatric: Normal mood and affect  Laboratory Data:  No results for input(s): "WBC", "HGB", "HCT", "PLT" in the last 72 hours.  No results for input(s): "NA", "K", "CL", "GLUCOSE", "BUN", "CALCIUM", "CREATININE" in the last 72 hours.  Invalid input(s): "CO3"   No results found for this or any previous visit (from the past 24 hour(s)). No results found for this or any previous visit (from the past 240 hour(s)).  Renal Function: Recent Labs    11/30/22 0800  CREATININE 0.87   Estimated Creatinine Clearance: 118.3 mL/min (by C-G formula based on SCr of 0.87 mg/dL).  Radiologic Imaging: No results found.  I independently reviewed the above imaging studies.  Assessment and Plan JERAMY DIMMICK is a 67 y.o. male with bladder lesion here for bladder biopsy, possible TURBT.  Risks and benefits of Transurethral Resection of Bladder Tumor were reviewed in detail including infection, bleeding, blood transfusion, injury to bladder/urethra/surrounding structures, bladder perforation, obstructive and irritative voiding symptoms, and global anesthesia risks including but not limited to CVA, MI, DVT, PE, pneumonia, and death. Patient expressed understanding and desire to proceed.   Matt R. Silas Sedam MD 12/03/2022, 1:09 PM  [facility] Pager: [phone]

## 2022-12-03 NOTE — Discharge Instructions (Signed)

## 2022-12-03 NOTE — Transfer of Care (Signed)
Immediate Anesthesia Transfer of Care Note  Patient: Brian Villanueva  Procedure(s) Performed: CYSTOSCOPY TRANSURETHRAL RESECTION OF BLADDER TUMOR (TURBT)  Patient Location: PACU  Anesthesia Type:General  Level of Consciousness: awake, alert , and oriented  Airway & Oxygen Therapy: Patient Spontanous Breathing and Patient connected to face mask oxygen  Post-op Assessment: Report given to RN and Post -op Vital signs reviewed and stable  Post vital signs: Reviewed and stable  Last Vitals:  Vitals Value Taken Time  BP 148/74 12/03/22 1523  Temp 37 C 12/03/22 1522  Pulse 73 12/03/22 1525  Resp 9 12/03/22 1525  SpO2 100 % 12/03/22 1525  Vitals shown include unvalidated device data.  Last Pain:  Vitals:   12/03/22 1522  TempSrc:   PainSc: 0-No pain         Complications: No notable events documented.

## 2022-12-03 NOTE — Anesthesia Procedure Notes (Signed)
Procedure Name: Intubation Date/Time: 12/03/2022 2:47 PM  Performed by: West Pugh, CRNAPre-anesthesia Checklist: Patient identified, Emergency Drugs available, Suction available, Patient being monitored and Timeout performed Patient Re-evaluated:Patient Re-evaluated prior to induction Oxygen Delivery Method: Circle system utilized Preoxygenation: Pre-oxygenation with 100% oxygen Induction Type: IV induction Ventilation: Mask ventilation without difficulty Laryngoscope Size: Mac, 3 and 4 Grade View: Grade I Tube type: Oral Tube size: 7.5 mm Number of attempts: 1 Airway Equipment and Method: Stylet Placement Confirmation: ETT inserted through vocal cords under direct vision, positive ETCO2, CO2 detector and breath sounds checked- equal and bilateral Secured at: 22 cm Tube secured with: Tape Dental Injury: Teeth and Oropharynx as per pre-operative assessment

## 2022-12-03 NOTE — Anesthesia Preprocedure Evaluation (Addendum)
Anesthesia Evaluation  Patient identified by MRN, date of birth, ID band Patient awake    Reviewed: Allergy & Precautions, NPO status , Patient's Chart, lab work & pertinent test results  History of Anesthesia Complications Negative for: history of anesthetic complications  Airway Mallampati: III  TM Distance: >3 FB Neck ROM: Full    Dental no notable dental hx. (+) Dental Advisory Given   Pulmonary Current Smoker and Patient abstained from smoking.   Pulmonary exam normal        Cardiovascular negative cardio ROS Normal cardiovascular exam     Neuro/Psych   Anxiety     negative neurological ROS     GI/Hepatic Neg liver ROS,GERD  Medicated,,  Endo/Other  negative endocrine ROS    Renal/GU negative Renal ROS     Musculoskeletal  (+) Arthritis ,    Abdominal   Peds  Hematology negative hematology ROS (+)   Anesthesia Other Findings   Reproductive/Obstetrics                             Anesthesia Physical Anesthesia Plan  ASA: 2  Anesthesia Plan: General   Post-op Pain Management: Tylenol PO (pre-op)* and Toradol IV (intra-op)*   Induction: Intravenous  PONV Risk Score and Plan: 3 and Ondansetron, Dexamethasone, Midazolam and Treatment may vary due to age or medical condition  Airway Management Planned: Oral ETT  Additional Equipment:   Intra-op Plan:   Post-operative Plan: Extubation in OR  Informed Consent: I have reviewed the patients History and Physical, chart, labs and discussed the procedure including the risks, benefits and alternatives for the proposed anesthesia with the patient or authorized representative who has indicated his/her understanding and acceptance.     Dental advisory given  Plan Discussed with: Anesthesiologist and CRNA  Anesthesia Plan Comments:        Anesthesia Quick Evaluation

## 2022-12-04 ENCOUNTER — Encounter (HOSPITAL_COMMUNITY): Payer: Self-pay | Admitting: Urology

## 2022-12-04 LAB — SURGICAL PATHOLOGY

## 2022-12-04 NOTE — Anesthesia Postprocedure Evaluation (Signed)
Anesthesia Post Note  Patient: Brian Villanueva  Procedure(s) Performed: CYSTOSCOPY TRANSURETHRAL RESECTION OF BLADDER TUMOR (TURBT)     Patient location during evaluation: PACU Anesthesia Type: General Level of consciousness: sedated Pain management: pain level controlled Vital Signs Assessment: post-procedure vital signs reviewed and stable Respiratory status: spontaneous breathing and respiratory function stable Cardiovascular status: stable Postop Assessment: no apparent nausea or vomiting Anesthetic complications: no   No notable events documented.  Last Vitals:  Vitals:   12/03/22 1600 12/03/22 1608  BP: (!) 151/74 (!) 157/74  Pulse: 64 62  Resp: 18 20  Temp:    SpO2: 93% 96%    Last Pain:  Vitals:   12/03/22 1608  TempSrc:   PainSc: 0-No pain                 Mio Schellinger DANIEL

## 2022-12-05 ENCOUNTER — Encounter: Payer: Self-pay | Admitting: Family Medicine

## 2022-12-05 DIAGNOSIS — N3289 Other specified disorders of bladder: Secondary | ICD-10-CM | POA: Insufficient documentation

## 2022-12-05 DIAGNOSIS — N329 Bladder disorder, unspecified: Secondary | ICD-10-CM | POA: Insufficient documentation

## 2022-12-10 DIAGNOSIS — R3912 Poor urinary stream: Secondary | ICD-10-CM | POA: Diagnosis not present

## 2022-12-10 DIAGNOSIS — N401 Enlarged prostate with lower urinary tract symptoms: Secondary | ICD-10-CM | POA: Diagnosis not present

## 2022-12-10 DIAGNOSIS — R3121 Asymptomatic microscopic hematuria: Secondary | ICD-10-CM | POA: Diagnosis not present

## 2023-01-10 DIAGNOSIS — G4733 Obstructive sleep apnea (adult) (pediatric): Secondary | ICD-10-CM | POA: Diagnosis not present

## 2023-01-26 IMAGING — US US ABDOMEN COMPLETE
1 series · 13 of 25 positions shown · non-contrast
Comparison: None.

CLINICAL DATA: Right upper quadrant abdominal pain.

EXAM:
ABDOMEN ULTRASOUND COMPLETE

[Series 1: us abdomen complete · 13 of 109 slices shown]
[im 1/109]
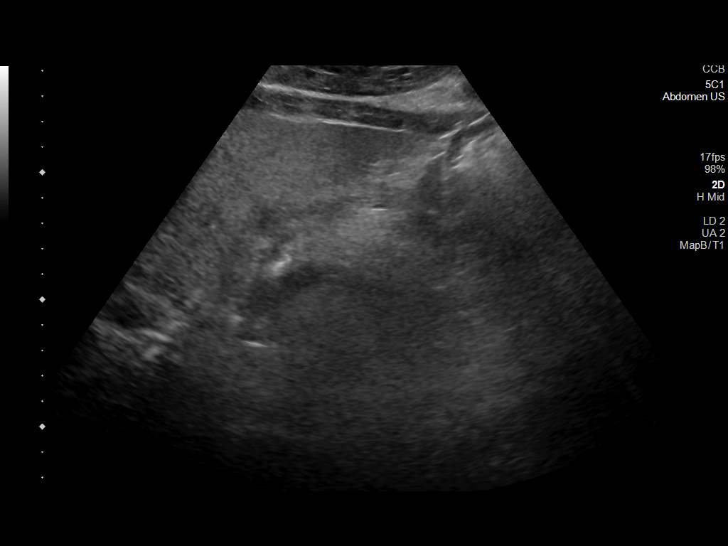
[im 10/109]
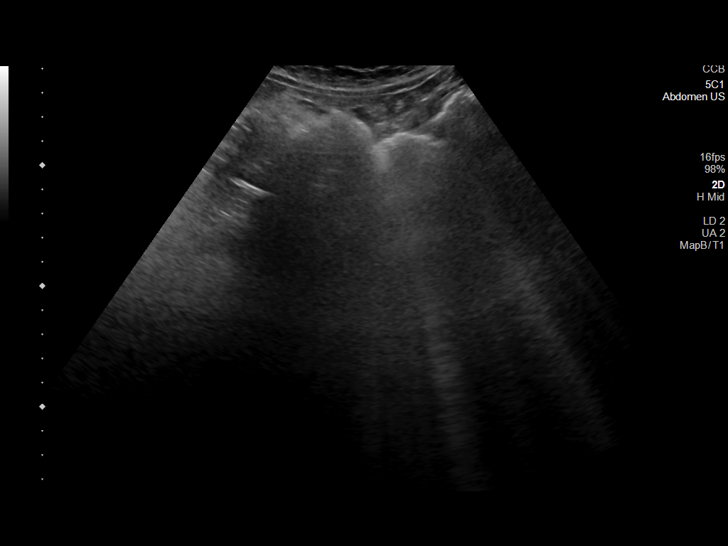
[im 19/109]
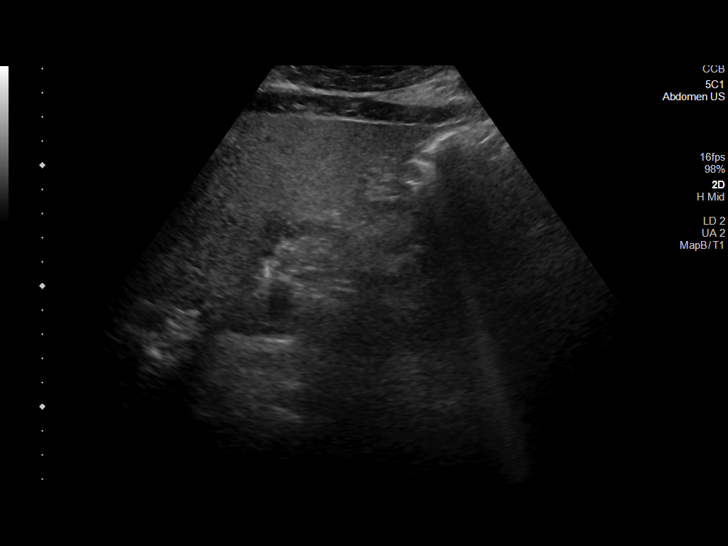
[im 28/109]
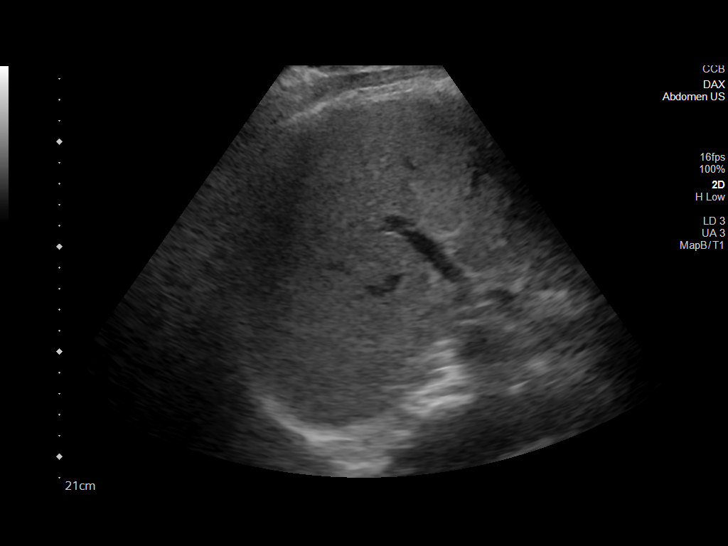
[im 37/109]
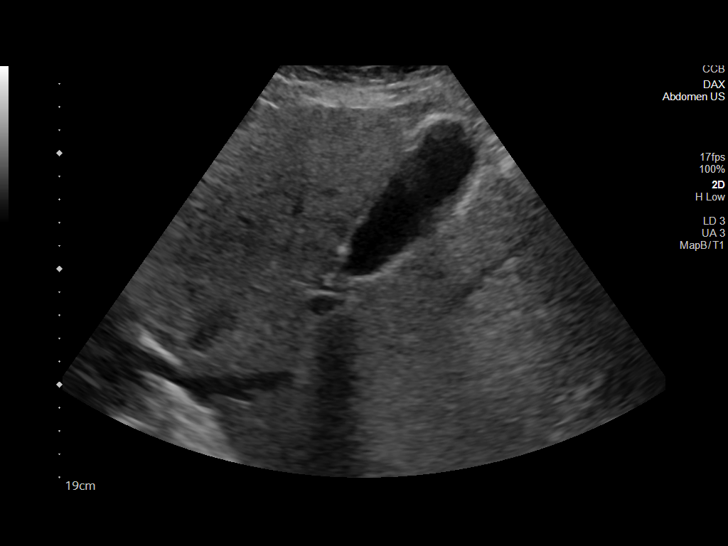
[im 46/109]
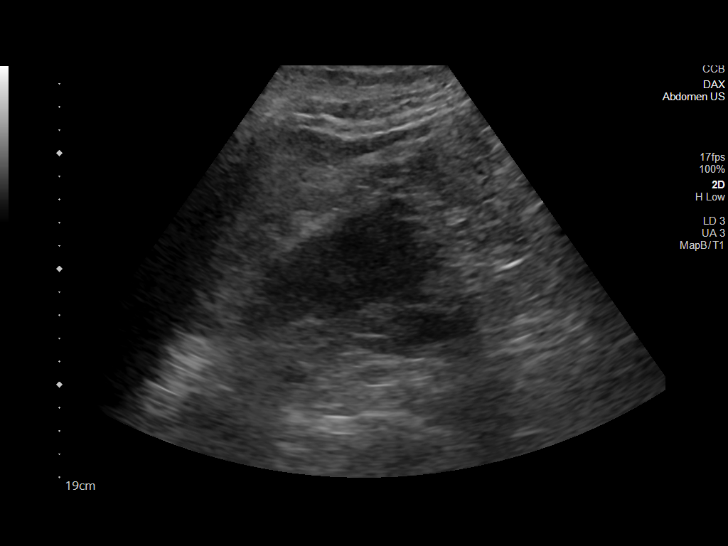
[im 55/109]
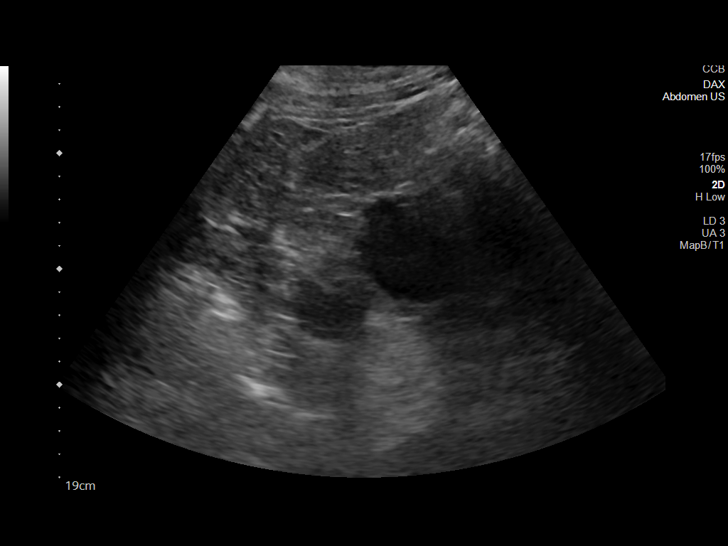
[im 64/109]
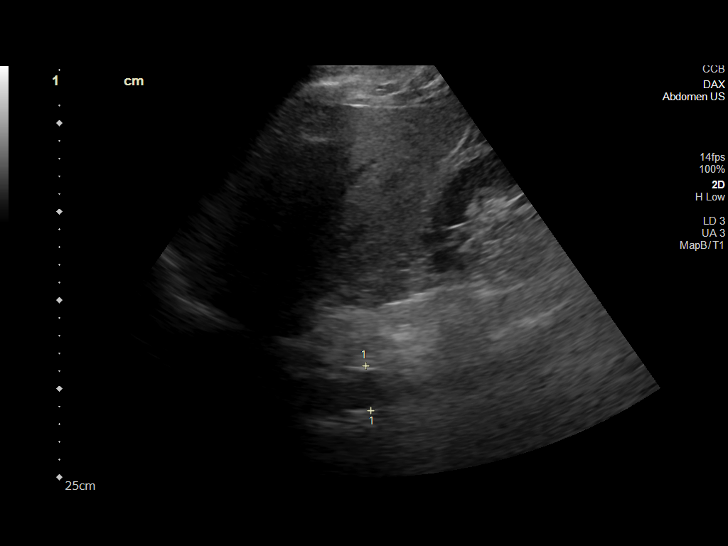
[im 73/109]
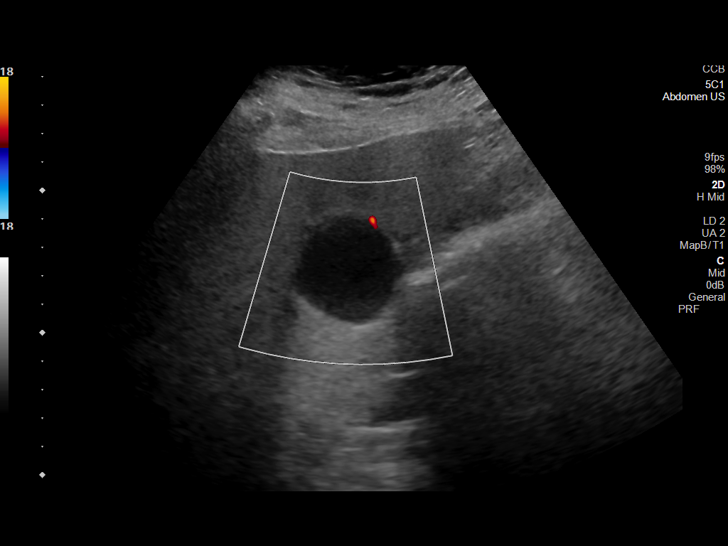
[im 82/109]
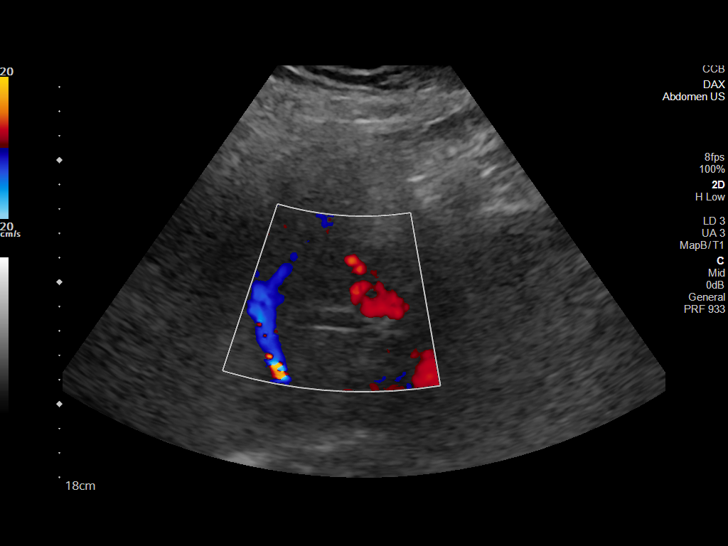
[im 91/109]
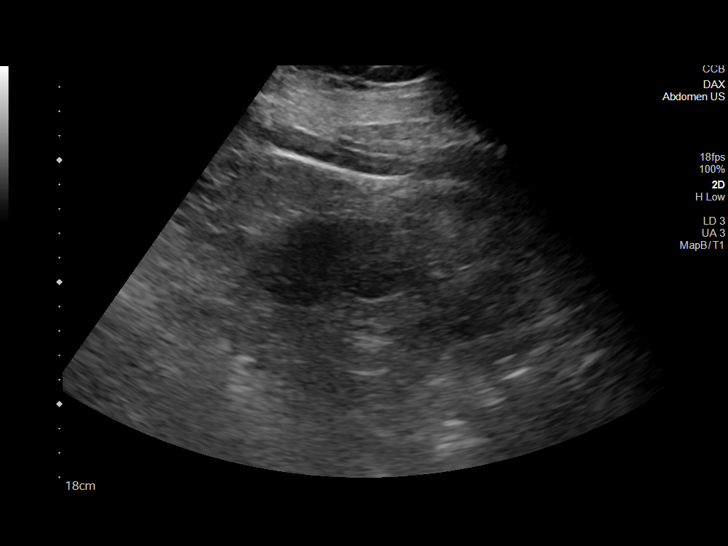
[im 100/109]
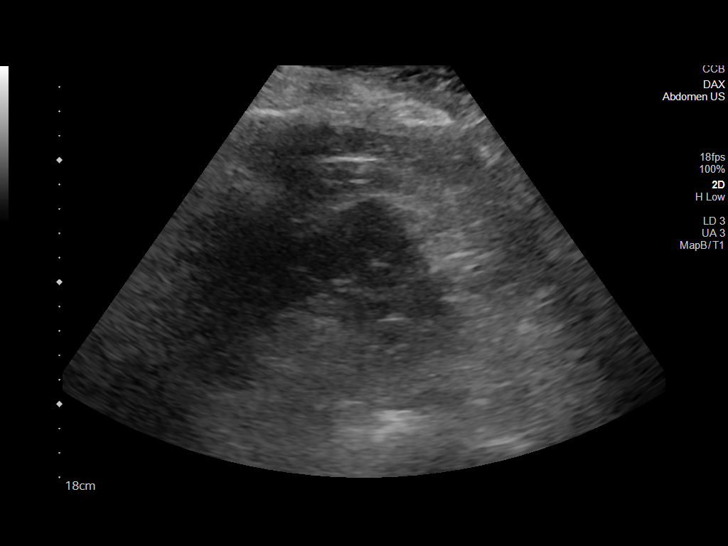
[im 109/109]
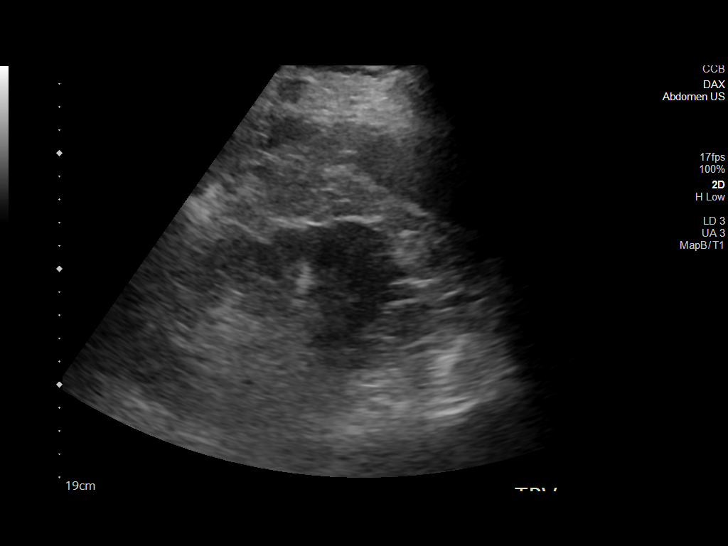

[13 of 25 positions shown; findings below may reference images not displayed]

FINDINGS: Gallbladder: No gallstones or wall thickening visualized. No
sonographic Murphy sign noted by sonographer.

Common bile duct: Diameter: 5 mm which is within normal limits.

Liver: Increased echogenicity of hepatic parenchyma is noted
suggesting hepatic steatosis. 2.6 x 1.9 cm hypoechoic area is seen
in right hepatic lobe adjacent to gallbladder fossa which may
represent focal fatty sparing, but mass cannot be excluded. Portal
vein is patent on color Doppler imaging with normal direction of
blood flow towards the liver.

IVC: No abnormality visualized.

Pancreas: Visualized portion is unremarkable.

Spleen: Size and appearance within normal limits.

Right Kidney: Length: 14.3 cm. Prominent column of Bertin is noted.
Echogenicity within normal limits. No mass or hydronephrosis
visualized.

Left Kidney: Length: 13.9 cm. 4.3 cm simple cyst is seen in lower
pole. Echogenicity within normal limits. No mass or hydronephrosis
visualized.

Abdominal aorta: 3.6 cm infrarenal abdominal aortic aneurysm is
noted.

Other findings: None.
IMPRESSION: Hepatic steatosis.

2.6 x 1.9 cm hypoechoic area is seen in right hepatic lobe adjacent
to gallbladder fossa which may represent focal fatty infiltration,
but given its well-defined margins mass cannot be excluded. Further
evaluation with MRI with intravenous contrast is recommended.

3.6 cm infrarenal abdominal aortic aneurysm. Recommend follow-up
ultrasound every 2 years. This recommendation follows ACR consensus
guidelines: White Paper of the ACR Incidental Findings Committee II

## 2023-02-08 DIAGNOSIS — G4733 Obstructive sleep apnea (adult) (pediatric): Secondary | ICD-10-CM | POA: Diagnosis not present

## 2023-02-14 DIAGNOSIS — G4733 Obstructive sleep apnea (adult) (pediatric): Secondary | ICD-10-CM | POA: Diagnosis not present

## 2023-03-04 ENCOUNTER — Ambulatory Visit: Payer: Medicare Other | Admitting: Internal Medicine

## 2023-03-05 ENCOUNTER — Telehealth: Payer: Self-pay | Admitting: Family Medicine

## 2023-03-05 NOTE — Telephone Encounter (Signed)
Contacted Brian Villanueva to schedule their annual wellness visit. Appointment made for 04/16/2023.  [facility] Guide [facility] AWV TEAM Direct Dial: [phone]

## 2023-03-16 DIAGNOSIS — G4733 Obstructive sleep apnea (adult) (pediatric): Secondary | ICD-10-CM | POA: Diagnosis not present

## 2023-04-10 DIAGNOSIS — G4733 Obstructive sleep apnea (adult) (pediatric): Secondary | ICD-10-CM | POA: Diagnosis not present

## 2023-04-13 NOTE — Progress Notes (Deleted)
HPI M Cigar Smoker followed for OSA, complicated by  AAA, GERD, Fatty Liver, Back Pain, hx Covid infection, Dyslipidemia, Obesity,  HST 10/26/22- AHI 11/ hr, desaturation to 82%, body weight 283 lbs  ==============================================================================    11/30/22-  66 yoM Cigar Smoker followed for OSA, complicated by  AAA, GERD, Fatty Liver, Back Pain, hx Covid infection, Dyslipidemia, Obesity,  HST 10/26/22- AHI 11/ hr, desaturation to 82%, body weight 283 lbs Epworth score-9 Body weight today-287 lbs                          Covid vax-3 Phizer Flu vax-had To discuss treatment options Here with wife. We reviewed sleep study results and treatment considerations. He was leaning toward OAP but she pressed for CPAP.   04/16/23- 66 yoM Cigar Smoker followed for OSA, complicated by  AAA, GERD, Fatty Liver, Back Pain, hx Covid infection, Dyslipidemia, Obesity,  CPAP auto 5-15/ [employer]    new order 11/30/22 Download compliance Body weight today-     ROS-see HPI   + = positive Constitutional:    weight loss, night sweats, fevers, chills, fatigue, lassitude. HEENT:    headaches, difficulty swallowing, tooth/dental problems, sore throat,       sneezing, itching, ear ache, nasal congestion, post nasal drip, snoring CV:    chest pain, orthopnea, PND, swelling in lower extremities, anasarca,                                   dizziness, palpitations Resp:   shortness of breath with exertion or at rest.                productive cough,   non-productive cough, coughing up of blood.              change in color of mucus.  wheezing.   Skin:    rash or lesions. GI:  No-   heartburn, +indigestion, abdominal pain, nausea, vomiting, diarrhea,                 change in bowel habits, loss of appetite GU: dysuria, change in color of urine, no urgency or frequency.   flank pain. MS:   joint pain, stiffness, decreased range of motion, back pain. Neuro-     nothing unusual Psych:   change in mood or affect.  depression or anxiety.   memory loss.  OBJ- Physical Exam General- Alert, Oriented, Affect-appropriate, Distress- none acute, +big man Skin- rash-none, lesions- none, excoriation- none Lymphadenopathy- none Head- atraumatic            Eyes- Gross vision intact, PERRLA, conjunctivae and secretions clear            Ears- Hearing, canals-normal            Nose- Clear, no-Septal dev, mucus, polyps, erosion, perforation             Throat- Mallampati III-IV , mucosa clear , drainage- none, tonsils absent, +teeth Neck- flexible , trachea midline, no stridor , thyroid nl, carotid no bruit Chest - symmetrical excursion , unlabored           Heart/CV- RRR , no murmur , no gallop  , no rub, nl s1 s2                           - JVD- none , edema-  none, stasis changes- none, varices- none           Lung- clear to P&A, wheeze- none, cough- none , dullness-none, rub- none           Chest wall-  Abd-  Br/ Gen/ Rectal- Not done, not indicated Extrem- cyanosis- none, clubbing, none, atrophy- none, strength- nl Neuro- grossly intact to observation

## 2023-04-16 ENCOUNTER — Ambulatory Visit: Payer: Medicare Other | Admitting: Internal Medicine

## 2023-04-16 ENCOUNTER — Ambulatory Visit (INDEPENDENT_AMBULATORY_CARE_PROVIDER_SITE_OTHER): Payer: Medicare Other

## 2023-04-16 VITALS — Wt 287.0 lb

## 2023-04-16 DIAGNOSIS — G4733 Obstructive sleep apnea (adult) (pediatric): Secondary | ICD-10-CM | POA: Diagnosis not present

## 2023-04-16 DIAGNOSIS — Z0000 Encounter for general adult medical examination without abnormal findings: Secondary | ICD-10-CM

## 2023-04-16 NOTE — Patient Instructions (Signed)
Brian Villanueva , Thank you for taking time to come for your Medicare Wellness Visit. I appreciate your ongoing commitment to your health goals. Please review the following plan we discussed and let me know if I can assist you in the future.   These are the goals we discussed:  Goals       Patient Stated     Patient stated (pt-stated)      I would like to lose a little more weight      Other     Patient Stated      03/03/2020, I will maintain and continue medications as prescribed.       Patient Stated      03/08/2021, I will maintain and continue medications as prescribed.        This is a list of the screening recommended for you and due dates:  Health Maintenance  Topic Date Due   DTaP/Tdap/Td vaccine (1 - Tdap) Never done   COVID-19 Vaccine (4 - 2023-24 season) 04/23/2024*   Zoster (Shingles) Vaccine (1 of 2) 04/24/2024*   Flu Shot  06/13/2023   Medicare Annual Wellness Visit  04/15/2024   Colon Cancer Screening  03/17/2030   Pneumonia Vaccine  Completed   Hepatitis C Screening  Completed   HPV Vaccine  Aged Out  *Topic was postponed. The date shown is not the original due date.    Advanced directives: Advance directive discussed with you today. Even though you declined this today, please call our office should you change your mind, and we can give you the proper paperwork for you to fill out.   Conditions/risks identified: Aim for 30 minutes of exercise or brisk walking, 6-8 glasses of water, and 5 servings of fruits and vegetables each day.   Next appointment: Follow up in one year for your annual wellness visit. 04/17/24  Preventive Care 65 Years and Older, Male  Preventive care refers to lifestyle choices and visits with your health care provider that can promote health and wellness. What does preventive care include? A yearly physical exam. This is also called an annual well check. Dental exams once or twice a year. Routine eye exams. Ask your health care provider  how often you should have your eyes checked. Personal lifestyle choices, including: Daily care of your teeth and gums. Regular physical activity. Eating a healthy diet. Avoiding tobacco and drug use. Limiting alcohol use. Practicing safe sex. Taking low doses of aspirin every day. Taking vitamin and mineral supplements as recommended by your health care provider. What happens during an annual well check? The services and screenings done by your health care provider during your annual well check will depend on your age, overall health, lifestyle risk factors, and family history of disease. Counseling  Your health care provider may ask you questions about your: Alcohol use. Tobacco use. Drug use. Emotional well-being. Home and relationship well-being. Sexual activity. Eating habits. History of falls. Memory and ability to understand (cognition). Work and work Astronomer. Screening  You may have the following tests or measurements: Height, weight, and BMI. Blood pressure. Lipid and cholesterol levels. These may be checked every 5 years, or more frequently if you are over 40 years old. Skin check. Lung cancer screening. You may have this screening every year starting at age 87 if you have a 30-pack-year history of smoking and currently smoke or have quit within the past 15 years. Fecal occult blood test (FOBT) of the stool. You may have this test every year  starting at age 63. Flexible sigmoidoscopy or colonoscopy. You may have a sigmoidoscopy every 5 years or a colonoscopy every 10 years starting at age 55. Prostate cancer screening. Recommendations will vary depending on your family history and other risks. Hepatitis C blood test. Hepatitis B blood test. Sexually transmitted disease (STD) testing. Diabetes screening. This is done by checking your blood sugar (glucose) after you have not eaten for a while (fasting). You may have this done every 1-3 years. Abdominal aortic aneurysm  (AAA) screening. You may need this if you are a current or former smoker. Osteoporosis. You may be screened starting at age 79 if you are at high risk. Talk with your health care provider about your test results, treatment options, and if necessary, the need for more tests. Vaccines  Your health care provider may recommend certain vaccines, such as: Influenza vaccine. This is recommended every year. Tetanus, diphtheria, and acellular pertussis (Tdap, Td) vaccine. You may need a Td booster every 10 years. Zoster vaccine. You may need this after age 70. Pneumococcal 13-valent conjugate (PCV13) vaccine. One dose is recommended after age 32. Pneumococcal polysaccharide (PPSV23) vaccine. One dose is recommended after age 45. Talk to your health care provider about which screenings and vaccines you need and how often you need them. This information is not intended to replace advice given to you by your health care provider. Make sure you discuss any questions you have with your health care provider. Document Released: 11/25/2015 Document Revised: 07/18/2016 Document Reviewed: 08/30/2015 Elsevier Interactive Patient Education  2017 [employer].  Fall Prevention in the Home Falls can cause injuries. They can happen to people of all ages. There are many things you can do to make your home safe and to help prevent falls. What can I do on the outside of my home? Regularly fix the edges of walkways and driveways and fix any cracks. Remove anything that might make you trip as you walk through a door, such as a raised step or threshold. Trim any bushes or trees on the path to your home. Use bright outdoor lighting. Clear any walking paths of anything that might make someone trip, such as rocks or tools. Regularly check to see if handrails are loose or broken. Make sure that both sides of any steps have handrails. Any raised decks and porches should have guardrails on the edges. Have any leaves, snow, or  ice cleared regularly. Use sand or salt on walking paths during winter. Clean up any spills in your garage right away. This includes oil or grease spills. What can I do in the bathroom? Use night lights. Install grab bars by the toilet and in the tub and shower. Do not use towel bars as grab bars. Use non-skid mats or decals in the tub or shower. If you need to sit down in the shower, use a plastic, non-slip stool. Keep the floor dry. Clean up any water that spills on the floor as soon as it happens. Remove soap buildup in the tub or shower regularly. Attach bath mats securely with double-sided non-slip rug tape. Do not have throw rugs and other things on the floor that can make you trip. What can I do in the bedroom? Use night lights. Make sure that you have a light by your bed that is easy to reach. Do not use any sheets or blankets that are too big for your bed. They should not hang down onto the floor. Have a firm chair that has side arms.  You can use this for support while you get dressed. Do not have throw rugs and other things on the floor that can make you trip. What can I do in the kitchen? Clean up any spills right away. Avoid walking on wet floors. Keep items that you use a lot in easy-to-reach places. If you need to reach something above you, use a strong step stool that has a grab bar. Keep electrical cords out of the way. Do not use floor polish or wax that makes floors slippery. If you must use wax, use non-skid floor wax. Do not have throw rugs and other things on the floor that can make you trip. What can I do with my stairs? Do not leave any items on the stairs. Make sure that there are handrails on both sides of the stairs and use them. Fix handrails that are broken or loose. Make sure that handrails are as long as the stairways. Check any carpeting to make sure that it is firmly attached to the stairs. Fix any carpet that is loose or worn. Avoid having throw rugs at  the top or bottom of the stairs. If you do have throw rugs, attach them to the floor with carpet tape. Make sure that you have a light switch at the top of the stairs and the bottom of the stairs. If you do not have them, ask someone to add them for you. What else can I do to help prevent falls? Wear shoes that: Do not have high heels. Have rubber bottoms. Are comfortable and fit you well. Are closed at the toe. Do not wear sandals. If you use a stepladder: Make sure that it is fully opened. Do not climb a closed stepladder. Make sure that both sides of the stepladder are locked into place. Ask someone to hold it for you, if possible. Clearly mark and make sure that you can see: Any grab bars or handrails. First and last steps. Where the edge of each step is. Use tools that help you move around (mobility aids) if they are needed. These include: Canes. Walkers. Scooters. Crutches. Turn on the lights when you go into a dark area. Replace any light bulbs as soon as they burn out. Set up your furniture so you have a clear path. Avoid moving your furniture around. If any of your floors are uneven, fix them. If there are any pets around you, be aware of where they are. Review your medicines with your doctor. Some medicines can make you feel dizzy. This can increase your chance of falling. Ask your doctor what other things that you can do to help prevent falls. This information is not intended to replace advice given to you by your health care provider. Make sure you discuss any questions you have with your health care provider. Document Released: 08/25/2009 Document Revised: 04/05/2016 Document Reviewed: 12/03/2014 Elsevier Interactive Patient Education  2017 [employer].

## 2023-04-16 NOTE — Progress Notes (Unsigned)
Subjective:   Brian Villanueva is a 67 y.o. male who presents for Medicare Annual/Subsequent preventive examination.  Review of Systems    I connected with  ADNAAN MOWAD on 04/16/23 by a audio enabled telemedicine application and verified that I am speaking with the correct person using two identifiers.  Patient Location: Home  Provider Location: Home Office  I discussed the limitations of evaluation and management by telemedicine. The patient expressed understanding and agreed to proceed.  Cardiac Risk Factors include: advanced age (>63men, >59 women)     Objective:    Today's Vitals   04/16/23 0903  Weight: 287 lb (130.2 kg)   Body mass index is 35.87 kg/m.     04/16/2023    9:11 AM 12/03/2022   12:29 PM 11/30/2022    8:19 AM 04/03/2022    9:00 AM 03/08/2021    1:15 PM 03/03/2020    9:48 AM  Advanced Directives  Does Patient Have a Medical Advance Directive? No No No No No No  Would patient like information on creating a medical advance directive? No - Patient declined No - Patient declined  No - Patient declined No - Patient declined No - Patient declined    Current Medications (verified) Outpatient Encounter Medications as of 04/16/2023  Medication Sig   acetaminophen (TYLENOL) 500 MG tablet Take 500-1,000 mg by mouth every 6 (six) hours as needed (pain.).   aspirin-acetaminophen-caffeine (EXCEDRIN MIGRAINE) 250-250-65 MG tablet Take 1-2 tablets by mouth every 8 (eight) hours as needed for headache.   Cholecalciferol (VITAMIN D3) 125 MCG (5000 UT) TABS Take 5,000 Units by mouth daily.   cyclobenzaprine (FLEXERIL) 10 MG tablet Take 0.5-1 tablets (5-10 mg total) by mouth 3 (three) times daily as needed for muscle spasms (sedation precautions).   docusate sodium (COLACE) 100 MG capsule Take 1 capsule (100 mg total) by mouth daily as needed for up to 30 doses.   Multiple Vitamin (MULTIVITAMIN WITH MINERALS) TABS tablet Take 1 tablet by mouth in the morning.   omeprazole  (PRILOSEC) 40 MG capsule Take 1 capsule (40 mg total) by mouth daily.   oxyCODONE-acetaminophen (PERCOCET) 5-325 MG tablet Take 1 tablet by mouth every 4 (four) hours as needed for up to 18 doses for severe pain.   sertraline (ZOLOFT) 25 MG tablet Take 1 tablet by mouth once daily   tamsulosin (FLOMAX) 0.4 MG CAPS capsule Take 0.4 mg by mouth at bedtime.   vitamin C (VITAMIN C) 500 MG tablet Take 2 tablets (1,000 mg total) by mouth daily.   Zinc 22.5 MG TABS Take 22.5 mg by mouth in the morning.   No facility-administered encounter medications on file as of 04/16/2023.    Allergies (verified) Patient has no known allergies.   History: Past Medical History:  Diagnosis Date   AAA (abdominal aortic aneurysm) (HCC) 11/20/2021   Abd [place] 11/2021 - 3.6cm infrarenal AAA - rec rpt [place] 2 yrs   Anxiety    Arthritis    COVID-19 virus infection 03/17/2021   Dyslipidemia 03/13/2020   GERD (gastroesophageal reflux disease)    Obesity, Class II, BMI 35-39.9, no comorbidity 02/22/2020   Pre-diabetes    Sleep apnea    Past Surgical History:  Procedure Laterality Date   AUTOGRAFT BONE SPINE     remote lumbar spine fusion with autograft   COLONOSCOPY  03/2020   HP, rpt 10 yrs ([place])   CYSTOSCOPY WITH BIOPSY N/A 12/03/2022   Procedure: CYSTOSCOPY;  Surgeon: Jannifer Hick, MD;  Location: [facility];  Service: Urology;  Laterality: N/A;  30 MINUTES NEEDED FOR CASE  ANESTHESIA PARALYZED, INTUBATED   kidney stone removal     20+ years ago    KNEE SURGERY Left    x2 growing up - ligament tears   TONSILLECTOMY     TRANSURETHRAL RESECTION OF BLADDER TUMOR N/A 12/03/2022   Procedure: TRANSURETHRAL RESECTION OF BLADDER TUMOR (TURBT);  Surgeon: Jannifer Hick, MD;  Location: [facility];  Service: Urology;  Laterality: N/A;   WISDOM TOOTH EXTRACTION     Family History  Problem Relation Age of Onset   Esophageal cancer Mother    High Cholesterol Father    High blood pressure Father    Atrial fibrillation  Father    Arthritis Maternal Grandfather    Cancer Paternal Grandmother        unsure   Colon polyps Neg Hx    Colon cancer Neg Hx    Rectal cancer Neg Hx    Stomach cancer Neg Hx    Social History   Socioeconomic History   Marital status: Married    Spouse name: Not on file   Number of children: Not on file   Years of education: Not on file   Highest education level: Not on file  Occupational History   Not on file  Tobacco Use   Smoking status: Light Smoker    Types: Cigars    Passive exposure: Current   Smokeless tobacco: Never   Tobacco comments:    Smokes about 3-5 cigars/cigarettes per day  Vaping Use   Vaping Use: Never used  Substance and Sexual Activity   Alcohol use: Yes    Comment: social - a few a month   Drug use: Never   Sexual activity: Yes    Partners: Female  Other Topics Concern   Not on file  Social History Narrative   Lives with wife and 13yo grand daughter living at home   Occupation: Advertising account planner with [employer]   Edu: HS   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   Diet: good water, fruits/vegetables daily   Social Determinants of Health   Financial Resource Strain: Low Risk  (04/16/2023)   Overall Financial Resource Strain (CARDIA)    Difficulty of Paying Living Expenses: Not hard at all  Food Insecurity: No Food Insecurity (04/16/2023)   Hunger Vital Sign    Worried About Running Out of Food in the Last Year: Never true    Ran Out of Food in the Last Year: Never true  Transportation Needs: No Transportation Needs (04/16/2023)   PRAPARE - Administrator, Civil Service (Medical): No    Lack of Transportation (Non-Medical): No  Physical Activity: Sufficiently Active (04/16/2023)   Exercise Vital Sign    Days of Exercise per Week: 7 days    Minutes of Exercise per Session: 30 min  Stress: No Stress Concern Present (04/16/2023)   [employer] of Occupational Health - Occupational Stress Questionnaire    Feeling of Stress : Not at all   Social Connections: Socially Integrated (04/16/2023)   Social Connection and Isolation Panel [NHANES]    Frequency of Communication with Friends and Family: More than three times a week    Frequency of Social Gatherings with Friends and Family: More than three times a week    Attends Religious Services: More than 4 times per year    Active Member of [employer] or Organizations: Yes    Attends Banker  Meetings: More than 4 times per year    Marital Status: Married    Tobacco Counseling Ready to quit: Not Answered Counseling given: Yes Tobacco comments: Smokes about 3-5 cigars/cigarettes per day   Clinical Intake:  Pre-visit preparation completed: Yes  Pain : No/denies pain     BMI - recorded: 35.87 Nutritional Status: BMI > 30  Obese Nutritional Risks: None Diabetes: No  How often do you need to have someone help you when you read instructions, pamphlets, or other written materials from your doctor or pharmacy?: 1 - Never  Diabetic?no  Interpreter Needed?: No  Information entered by :: Fredirick Maudlin   Activities of Daily Living    04/16/2023    9:12 AM 11/30/2022    8:21 AM  In your present state of health, do you have any difficulty performing the following activities:  Hearing? 0   Vision? 0   Difficulty concentrating or making decisions? 0   Walking or climbing stairs? 0   Dressing or bathing? 0   Doing errands, shopping? 0 0  Preparing Food and eating ? N   Using the Toilet? N   In the past six months, have you accidently leaked urine? N   Do you have problems with loss of bowel control? N   Managing your Medications? N   Managing your Finances? N   Housekeeping or managing your Housekeeping? N     Patient Care Team: Eustaquio Boyden, MD as PCP - General (Family Medicine) [place], Heather, OD (Optometry) Waymon Budge, MD as Consulting Physician (Pulmonary Disease)  Indicate any recent Medical Services you may have received from other  than [facility] providers in the past year (date may be approximate).     Assessment:   This is a routine wellness examination for Brian Villanueva.  Hearing/Vision screen Hearing Screening - Comments:: Denies hearing difficulties   Vision Screening - Comments:: Wears rx glasses - up to date with routine eye exams with  Dr Bur[place]i   Dietary issues and exercise activities discussed: Current Exercise Habits: Home exercise routine;The patient has a physically strenuous job, but has no regular exercise apart from work., Type of exercise: walking (farming), Time (Minutes): 50, Frequency (Times/Week): 5, Weekly Exercise (Minutes/Week): 250, Exercise limited by: orthopedic condition(s)   Goals Addressed               This Visit's Progress     Patient Stated     Patient stated (pt-stated)   On track     I would like to lose a little more weight      Depression Screen    04/16/2023    9:06 AM 06/08/2022   12:18 PM 04/03/2022    8:54 AM 03/08/2021    1:17 PM 03/03/2020    9:48 AM 02/22/2020   12:18 PM  PHQ 2/9 Scores  PHQ - 2 Score 0 2 0 0 0 0  PHQ- 9 Score  8  0 0 2    Fall Risk    04/16/2023    9:12 AM 04/03/2022    8:59 AM 03/08/2021    1:16 PM 03/03/2020    9:48 AM  Fall Risk   Falls in the past year? 0 0 0 0  Number falls in past yr: 0 0 0 0  Injury with Fall? 0 0 0 0  Risk for fall due to : No Fall Risks No Fall Risks No Fall Risks No Fall Risks  Follow up Falls prevention discussed;Falls evaluation completed  Falls evaluation  completed;Falls prevention discussed Falls evaluation completed;Falls prevention discussed    FALL RISK PREVENTION PERTAINING TO THE HOME:  Any stairs in or around the home? Yes  If so, are there any without handrails? No  Home free of loose throw rugs in walkways, pet beds, electrical cords, etc? No  Adequate lighting in your home to reduce risk of falls? Yes   ASSISTIVE DEVICES UTILIZED TO PREVENT FALLS:  Life alert? No  Use of a cane, walker or w/c? No  Grab  bars in the bathroom? No  Shower chair or bench in shower? No  Elevated toilet seat or a handicapped toilet? No   TIMED UP AND GO:  Was the test performed?  No, Televisit  .    Cognitive Function:    03/08/2021    1:22 PM 03/03/2020    9:49 AM  MMSE - Mini Mental State Exam  Not completed: Refused   Orientation to time  5  Orientation to Place  5  Registration  3  Attention/ Calculation  5  Recall  3  Language- repeat  1        04/16/2023    9:07 AM 04/03/2022    9:00 AM  6CIT Screen  What Year? 0 points 0 points  What month? 0 points 0 points  What time? 0 points 0 points  Count back from 20 0 points 0 points  Months in reverse 0 points 0 points  Repeat phrase 0 points 0 points  Total Score 0 points 0 points    Immunizations Immunization History  Administered Date(s) Administered   Influenza, High Dose Seasonal PF 08/22/2020   Influenza-Unspecified 07/31/2019, 08/11/2022   PFIZER(Purple Top)SARS-COV-2 Vaccination 12/25/2019, 01/19/2020, 10/14/2020   PNEUMOCOCCAL CONJUGATE-20 05/23/2021   Pneumococcal Polysaccharide-23 03/10/2020    TDAP status: Due, Education has been provided regarding the importance of this vaccine. Advised may receive this vaccine at local pharmacy or Health Dept. Aware to provide a copy of the vaccination record if obtained from local pharmacy or Health Dept. Verbalized acceptance and understanding.  Flu Vaccine status: Up to date  Pneumococcal vaccine status: Up to date  Covid-19 vaccine status: Information provided on how to obtain vaccines.   Qualifies for Shingles Vaccine? Yes   Zostavax completed No   Shingrix Completed?: No.    Education has been provided regarding the importance of this vaccine. Patient has been advised to call insurance company to determine out of pocket expense if they have not yet received this vaccine. Advised may also receive vaccine at local pharmacy or Health Dept. Verbalized acceptance and  understanding.  Screening Tests Health Maintenance  Topic Date Due   DTaP/Tdap/Td (1 - Tdap) Never done   COVID-19 Vaccine (4 - 2023-24 season) 04/23/2024 (Originally 07/13/2022)   Zoster Vaccines- Shingrix (1 of 2) 04/24/2024 (Originally 12/28/2003)   INFLUENZA VACCINE  06/13/2023   Medicare Annual Wellness (AWV)  04/15/2024   Colonoscopy  03/17/2030   Pneumonia Vaccine 44+ Years old  Completed   Hepatitis C Screening  Completed   HPV VACCINES  Aged Out    Health Maintenance  Health Maintenance Due  Topic Date Due   DTaP/Tdap/Td (1 - Tdap) Never done    Colorectal cancer screening: Type of screening: Cologuard. Completed 03/17/20. Repeat every 10 years  Lung Cancer Screening: (Low Dose CT Chest recommended if Age 56-80 years, 30 pack-year currently smoking OR have quit w/in 15years.) does qualify.   Lung Cancer Screening Referral: Yes  Additional Screening:  Hepatitis C Screening: does  qualify; Completed 03/13/21  Vision Screening: Recommended annual ophthalmology exams for early detection of glaucoma and other disorders of the eye. Is the patient up to date with their annual eye exam?  Yes  Who is the provider or what is the name of the office in which the patient attends annual eye exams? Dr [place]i If pt is not established with a provider, would they like to be referred to a provider to establish care? No .   Dental Screening: Recommended annual dental exams for proper oral hygiene  Community Resource Referral / Chronic Care Management: CRR required this visit?  No   CCM required this visit?  No      Plan:     I have personally reviewed and noted the following in the patient's chart:   Medical and social history Use of alcohol, tobacco or illicit drugs  Current medications and supplements including opioid prescriptions. Patient is not currently taking opioid prescriptions. Functional ability and status Nutritional status Physical activity Advanced  directives List of other physicians Hospitalizations, surgeries, and ER visits in previous 12 months Vitals Screenings to include cognitive, depression, and falls Referrals and appointments  In addition, I have reviewed and discussed with patient certain preventive protocols, quality metrics, and best practice recommendations. A written personalized care plan for preventive services as well as general preventive health recommendations were provided to patient.     Annabell Sabal, CMA   04/16/2023   Nurse Notes: None

## 2023-05-11 DIAGNOSIS — G4733 Obstructive sleep apnea (adult) (pediatric): Secondary | ICD-10-CM | POA: Diagnosis not present

## 2023-05-12 DIAGNOSIS — G4733 Obstructive sleep apnea (adult) (pediatric): Secondary | ICD-10-CM | POA: Diagnosis not present

## 2023-05-13 HISTORY — PX: INCISION AND DRAINAGE ABSCESS: SHX5864

## 2023-05-14 ENCOUNTER — Other Ambulatory Visit: Payer: Self-pay

## 2023-05-14 ENCOUNTER — Emergency Department (HOSPITAL_BASED_OUTPATIENT_CLINIC_OR_DEPARTMENT_OTHER): Payer: Medicare Other | Admitting: Radiology

## 2023-05-14 ENCOUNTER — Other Ambulatory Visit (HOSPITAL_BASED_OUTPATIENT_CLINIC_OR_DEPARTMENT_OTHER): Payer: Self-pay

## 2023-05-14 ENCOUNTER — Emergency Department (HOSPITAL_BASED_OUTPATIENT_CLINIC_OR_DEPARTMENT_OTHER)
Admission: EM | Admit: 2023-05-14 | Discharge: 2023-05-14 | Disposition: A | Discharge status: Home / Self Care | Payer: Medicare Other | Attending: Emergency Medicine | Admitting: Emergency Medicine

## 2023-05-14 ENCOUNTER — Encounter (HOSPITAL_BASED_OUTPATIENT_CLINIC_OR_DEPARTMENT_OTHER): Payer: Self-pay | Admitting: Emergency Medicine

## 2023-05-14 DIAGNOSIS — Z8616 Personal history of COVID-19: Secondary | ICD-10-CM | POA: Insufficient documentation

## 2023-05-14 DIAGNOSIS — Z23 Encounter for immunization: Secondary | ICD-10-CM | POA: Insufficient documentation

## 2023-05-14 DIAGNOSIS — W450XXA Nail entering through skin, initial encounter: Secondary | ICD-10-CM | POA: Diagnosis not present

## 2023-05-14 DIAGNOSIS — L03032 Cellulitis of left toe: Secondary | ICD-10-CM | POA: Diagnosis not present

## 2023-05-14 DIAGNOSIS — S99922A Unspecified injury of left foot, initial encounter: Secondary | ICD-10-CM | POA: Diagnosis not present

## 2023-05-14 DIAGNOSIS — Z7982 Long term (current) use of aspirin: Secondary | ICD-10-CM | POA: Diagnosis not present

## 2023-05-14 DIAGNOSIS — L089 Local infection of the skin and subcutaneous tissue, unspecified: Secondary | ICD-10-CM | POA: Insufficient documentation

## 2023-05-14 DIAGNOSIS — S91342A Puncture wound with foreign body, left foot, initial encounter: Secondary | ICD-10-CM | POA: Diagnosis not present

## 2023-05-14 DIAGNOSIS — M19072 Primary osteoarthritis, left ankle and foot: Secondary | ICD-10-CM | POA: Diagnosis not present

## 2023-05-14 LAB — CBC WITH DIFFERENTIAL/PLATELET
Abs Immature Granulocytes: 0.03 10*3/uL (ref 0.00–0.07)
Basophils Absolute: 0.1 10*3/uL (ref 0.0–0.1)
Basophils Relative: 1 %
Eosinophils Absolute: 0.1 10*3/uL (ref 0.0–0.5)
Eosinophils Relative: 2 %
HCT: 43.8 % (ref 39.0–52.0)
Hemoglobin: 14.4 g/dL (ref 13.0–17.0)
Immature Granulocytes: 0 %
Lymphocytes Relative: 19 %
Lymphs Abs: 1.7 10*3/uL (ref 0.7–4.0)
MCH: 30.3 pg (ref 26.0–34.0)
MCHC: 32.9 g/dL (ref 30.0–36.0)
MCV: 92.2 fL (ref 80.0–100.0)
Monocytes Absolute: 0.7 10*3/uL (ref 0.1–1.0)
Monocytes Relative: 8 %
Neutro Abs: 6.3 10*3/uL (ref 1.7–7.7)
Neutrophils Relative %: 70 %
Platelets: 168 10*3/uL (ref 150–400)
RBC: 4.75 MIL/uL (ref 4.22–5.81)
RDW: 13.6 % (ref 11.5–15.5)
WBC: 9 10*3/uL (ref 4.0–10.5)
nRBC: 0 % (ref 0.0–0.2)

## 2023-05-14 LAB — BASIC METABOLIC PANEL
Anion gap: 8 (ref 5–15)
BUN: 19 mg/dL (ref 8–23)
CO2: 29 mmol/L (ref 22–32)
Calcium: 9.6 mg/dL (ref 8.9–10.3)
Chloride: 101 mmol/L (ref 98–111)
Creatinine, Ser: 0.81 mg/dL (ref 0.61–1.24)
GFR, Estimated: >60 mL/min (ref >60)
Glucose, Bld: 98 mg/dL (ref 70–99)
Potassium: 4.5 mmol/L (ref 3.5–5.1)
Sodium: 138 mmol/L (ref 135–145)

## 2023-05-14 MED ORDER — CIPROFLOXACIN HCL 500 MG PO TABS
500.0000 mg | ORAL_TABLET | Freq: Two times a day (BID) | ORAL | 0 refills | Status: DC
  Filled 2023-05-14: qty 14, 7d supply, fill #0

## 2023-05-14 MED ORDER — TETANUS-DIPHTH-ACELL PERTUSSIS 5-2.5-18.5 LF-MCG/0.5 IM SUSY
0.5000 mL | PREFILLED_SYRINGE | Freq: Once | INTRAMUSCULAR | Status: AC
  Administered 2023-05-14: 0.5 mL via INTRAMUSCULAR
  Filled 2023-05-14: qty 0.5

## 2023-05-14 MED ORDER — DOXYCYCLINE HYCLATE 100 MG PO CAPS
100.0000 mg | ORAL_CAPSULE | Freq: Two times a day (BID) | ORAL | 0 refills | Status: DC
  Filled 2023-05-14: qty 14, 7d supply, fill #0

## 2023-05-14 NOTE — ED Triage Notes (Signed)
Pt arrives pov, steady gait, endorses stepping on nail x 3 days pta. Redness noted to LT great toe extending in to dorsal foot >Unknown last tetanus

## 2023-05-14 NOTE — ED Notes (Signed)
Unsuccessful IV attempt, labs collected. Pt tolerated well 

## 2023-05-14 NOTE — Discharge Instructions (Addendum)
Follow-up in 2 to 3 days with Delbert Harness.  If symptoms worsen you can return to the ER.

## 2023-05-14 NOTE — ED Provider Notes (Signed)
[facility] EMERGENCY DEPARTMENT AT [facility] Provider Note   CSN: [id number] Arrival date & time: 05/14/23  0935     History  Chief Complaint  Patient presents with   Toe Injury    Brian Villanueva is a 67 y.o. male.  HPI Patient stepped on a nail through his shoe around 3 days ago.  Stepped on his left great toe.  Now increasing redness and pain.  No fevers.  Unknown last tetanus.  Has seen Delbert Harness for previous surgeries.  Patient is not diabetic.   Past Medical History:  Diagnosis Date   AAA (abdominal aortic aneurysm) (HCC) 11/20/2021   Abd [place] 11/2021 - 3.6cm infrarenal AAA - rec rpt [place] 2 yrs   Anxiety    Arthritis    COVID-19 virus infection 03/17/2021   Dyslipidemia 03/13/2020   GERD (gastroesophageal reflux disease)    Obesity, Class II, BMI 35-39.9, no comorbidity 02/22/2020   Pre-diabetes    Sleep apnea     Home Medications Prior to Admission medications   Medication Sig Start Date End Date Taking? Authorizing Provider  ciprofloxacin (CIPRO) 500 MG tablet Take 1 tablet (500 mg total) by mouth 2 (two) times daily. 05/14/23  Yes Benjiman Core, MD  doxycycline (VIBRAMYCIN) 100 MG capsule Take 1 capsule (100 mg total) by mouth 2 (two) times daily. 05/14/23  Yes Benjiman Core, MD  acetaminophen (TYLENOL) 500 MG tablet Take 500-1,000 mg by mouth every 6 (six) hours as needed (pain.).    [provider]  aspirin-acetaminophen-caffeine (EXCEDRIN MIGRAINE) 650-667-2407 MG tablet Take 1-2 tablets by mouth every 8 (eight) hours as needed for headache.    [provider]  Cholecalciferol (VITAMIN D3) 125 MCG (5000 UT) TABS Take 5,000 Units by mouth daily.    [provider]  cyclobenzaprine (FLEXERIL) 10 MG tablet Take 0.5-1 tablets (5-10 mg total) by mouth 3 (three) times daily as needed for muscle spasms (sedation precautions). 06/08/22   Eustaquio Boyden, MD  docusate sodium (COLACE) 100 MG capsule Take 1 capsule (100 mg total) by  mouth daily as needed for up to 30 doses. 12/03/22   Jannifer Hick, MD  Multiple Vitamin (MULTIVITAMIN WITH MINERALS) TABS tablet Take 1 tablet by mouth in the morning.    [provider]  omeprazole (PRILOSEC) 40 MG capsule Take 1 capsule (40 mg total) by mouth daily. 06/08/22   Eustaquio Boyden, MD  oxyCODONE-acetaminophen (PERCOCET) 5-325 MG tablet Take 1 tablet by mouth every 4 (four) hours as needed for up to 18 doses for severe pain. 12/03/22   Jannifer Hick, MD  sertraline (ZOLOFT) 25 MG tablet Take 1 tablet by mouth once daily 09/25/22   Eustaquio Boyden, MD  tamsulosin (FLOMAX) 0.4 MG CAPS capsule Take 0.4 mg by mouth at bedtime. 11/14/22   [provider]  vitamin C (VITAMIN C) 500 MG tablet Take 2 tablets (1,000 mg total) by mouth daily. 02/22/20   Eustaquio Boyden, MD  Zinc 22.5 MG TABS Take 22.5 mg by mouth in the morning.    [provider]      Allergies    Patient has no known allergies.    Review of Systems   Review of Systems  Physical Exam Updated Vital Signs BP (!) 147/73   Pulse 70   Temp 98.1 F (36.7 C) (Oral)   Resp 16   Ht 6\' 3"  (1.905 m)   Wt 127 kg   SpO2 97%   BMI 35.00 kg/m  Physical Exam  Vitals and nursing note reviewed.  Cardiovascular:     Rate and Rhythm: Normal rate.  Musculoskeletal:        General: Tenderness present.     Comments: Tenderness and erythema with some pain over the left great toe.  Puncture wound inferiorly over the proximal phalanx.  Mild erythema up the foot.  No drainage.  Neurological:     Mental Status: He is alert.        ED Results / Procedures / Treatments   Labs (all labs ordered are listed, but only abnormal results are displayed) Labs Reviewed  CBC WITH DIFFERENTIAL/PLATELET  BASIC METABOLIC PANEL    EKG None  Radiology DG Toe Great Left  Result Date: 05/14/2023 CLINICAL DATA:  Puncture wound.  Stepped on a nail EXAM: LEFT GREAT TOE three views COMPARISON:  None Available.  FINDINGS: No fracture or dislocation. Preserved bone mineralization. Slight degenerative changes of the first metatarsophalangeal joint. No radiopaque foreign body identified. Please correlate with exact location of wound IMPRESSION: Mild degenerative changes. No definite radiopaque foreign body. No fracture. Electronically Signed   By: Karen Kays M.D.   On: 05/14/2023 10:35    Procedures Procedures    Medications Ordered in ED Medications  Tdap (BOOSTRIX) injection 0.5 mL (0.5 mLs Intramuscular Given 05/14/23 1048)    ED Course/ Medical Decision Making/ A&P                             Medical Decision Making Amount and/or Complexity of Data Reviewed Labs: ordered. Radiology: ordered.  Risk Prescription drug management.   Patient with apparent infection after puncture wound to left great toe.  No drainage.  Does have some pain with movement.  No fevers.  White count reassuring x-ray reassuring.  Will need antibiotic coverage and short-term follow-up.  White count reassuring.  X-ray reassuring.  Discussed with Dr.Marchwainy from orthopedic surgery.  He reviewed x-ray and pictures.  Will follow-up in the office in the next couple days.  Also discussed with Christiane Ha from pharmacy.  Will cover with doxycycline and Cipro to cover both MRSA and potential Pseudomonas since it came through issue.       Final Clinical Impression(s) / ED Diagnoses Final diagnoses:  Toe infection    Rx / DC Orders ED Discharge Orders          Ordered    doxycycline (VIBRAMYCIN) 100 MG capsule  2 times daily        05/14/23 1153    ciprofloxacin (CIPRO) 500 MG tablet  2 times daily        05/14/23 1153              Benjiman Core, MD 05/14/23 1204

## 2023-05-20 ENCOUNTER — Ambulatory Visit: Payer: Medicare Other | Admitting: Orthopedic Surgery

## 2023-05-20 DIAGNOSIS — S91132A Puncture wound without foreign body of left great toe without damage to nail, initial encounter: Secondary | ICD-10-CM | POA: Diagnosis not present

## 2023-05-20 DIAGNOSIS — L03032 Cellulitis of left toe: Secondary | ICD-10-CM | POA: Diagnosis not present

## 2023-05-20 DIAGNOSIS — M79675 Pain in left toe(s): Secondary | ICD-10-CM | POA: Diagnosis not present

## 2023-05-21 ENCOUNTER — Other Ambulatory Visit: Payer: Self-pay

## 2023-05-21 ENCOUNTER — Encounter (HOSPITAL_COMMUNITY): Payer: Self-pay | Admitting: Orthopedic Surgery

## 2023-05-21 ENCOUNTER — Encounter: Payer: Self-pay | Admitting: Orthopedic Surgery

## 2023-05-21 NOTE — Progress Notes (Signed)
Office Visit Note   Patient: Brian Villanueva           Date of Birth: 1956/02/21           MRN: [id number] Visit Date: 05/20/2023              Requested by: Eustaquio Boyden, MD [address] [place],  [place] [address] PCP: Eustaquio Boyden, MD  Chief Complaint  Patient presents with   Left Foot - Pain    Left great toe      HPI: Patient is a 67 year old gentleman who is seen for initial evaluation for cellulitis left great toe secondary to penetrating trauma from a nail.  Patient is seen in referral from [facility] orthopedics.  Patient states he has been on antibiotics for 7 days including Cipro and doxycycline.  Patient states that his tetanus is up-to-date.  Assessment & Plan: Visit Diagnoses:  1. Cellulitis of great toe of left foot   2. Puncture wound of great toe of left foot, initial encounter     Plan: Plan for surgical debridement on Wednesday.  Patient should finish his antibiotics tonight and this would give [place] 2 days off antibiotics to have a better chance of obtaining good tissue cultures.  Plan for outpatient surgery.  Risks and benefits were discussed including persistent infection possible bone infection need for additional surgery.  Follow-Up Instructions: Return in about 2 weeks (around 06/03/2023).   Ortho Exam  Patient is alert, oriented, no adenopathy, well-dressed, normal affect, normal respiratory effort. Examination patient has a good dorsalis pedis and posterior tibial pulse.  There is sausage digit swelling of the left great toe.  There is no ascending cellulitis no purulent drainage.  The penetrating wound has closed over.  Review of the radiographs shows no bony abnormalities shows no fluid.  Most recent hemoglobin A1c is 6.1.  Imaging: No results found. No images are attached to the encounter.  Labs: Lab Results  Component Value Date   HGBA1C 6.1 (H) 11/30/2022   HGBA1C 6.2 06/08/2022     Lab Results  Component Value Date    ALBUMIN 4.8 06/08/2022   ALBUMIN 4.6 11/17/2021   ALBUMIN 4.6 03/13/2021    No results found for: "MG" No results found for: "VD25OH"  No results found for: "PREALBUMIN"    Latest Ref Rng & Units 05/14/2023   10:25 AM 11/30/2022    8:00 AM 06/08/2022    1:01 PM  CBC EXTENDED  WBC 4.0 - 10.5 K/uL 9.0  6.3  7.2   RBC 4.22 - 5.81 MIL/uL 4.75  5.02  4.82   Hemoglobin 13.0 - 17.0 g/dL 57.3  22.0  25.4   HCT 39.0 - 52.0 % 43.8  46.1  45.0   Platelets 150 - 400 K/uL 168  175  172.0   NEUT# 1.7 - 7.7 K/uL 6.3   4.4   Lymph# 0.7 - 4.0 K/uL 1.7   2.2      There is no height or weight on file to calculate BMI.  Orders:  No orders of the defined types were placed in this encounter.  No orders of the defined types were placed in this encounter.    Procedures: No procedures performed  Clinical Data: No additional findings.  ROS:  All other systems negative, except as noted in the HPI. Review of Systems  Objective: Vital Signs: There were no vitals taken for this visit.  Specialty Comments:  No specialty comments available.  PMFS History:  Patient Active Problem List   Diagnosis Date Noted   Bladder mass 12/05/2022   OSA (obstructive sleep apnea) 11/30/2022   Hematospermia 06/09/2022   Chronic right-sided thoracic back pain 06/09/2022   Daytime somnolence 06/09/2022   AAA (abdominal aortic aneurysm) (HCC) 11/20/2021   Fatty liver 11/20/2021   Liver mass 11/20/2021   Microhematuria 11/20/2021   RUQ abdominal pain 11/17/2021   Prediabetes 05/23/2021   Dyslipidemia 03/13/2020   Encounter for general adult medical examination with abnormal findings 03/10/2020   Advanced directives, counseling/discussion 03/10/2020   Heartburn 02/22/2020   Obesity, Class II, BMI 35-39.9, no comorbidity 02/22/2020   Past Medical History:  Diagnosis Date   AAA (abdominal aortic aneurysm) (HCC) 11/20/2021   Abd [place] 11/2021 - 3.6cm infrarenal AAA - rec rpt [place] 2 yrs   Anxiety    Arthritis     COVID-19 virus infection 03/17/2021   Dyslipidemia 03/13/2020   GERD (gastroesophageal reflux disease)    Obesity, Class II, BMI 35-39.9, no comorbidity 02/22/2020   Pre-diabetes    Sleep apnea     Family History  Problem Relation Age of Onset   Esophageal cancer Mother    High Cholesterol Father    High blood pressure Father    Atrial fibrillation Father    Arthritis Maternal Grandfather    Cancer Paternal Grandmother        unsure   Colon polyps Neg Hx    Colon cancer Neg Hx    Rectal cancer Neg Hx    Stomach cancer Neg Hx     Past Surgical History:  Procedure Laterality Date   AUTOGRAFT BONE SPINE     remote lumbar spine fusion with autograft   COLONOSCOPY  03/2020   HP, rpt 10 yrs (Nandigam)   CYSTOSCOPY WITH BIOPSY N/A 12/03/2022   Procedure: CYSTOSCOPY;  Surgeon: Jannifer Hick, MD;  Location: [facility];  Service: Urology;  Laterality: N/A;  30 MINUTES NEEDED FOR CASE  ANESTHESIA PARALYZED, INTUBATED   kidney stone removal     20+ years ago    KNEE SURGERY Left    x2 growing up - ligament tears   TONSILLECTOMY     TRANSURETHRAL RESECTION OF BLADDER TUMOR N/A 12/03/2022   Procedure: TRANSURETHRAL RESECTION OF BLADDER TUMOR (TURBT);  Surgeon: Jannifer Hick, MD;  Location: [facility];  Service: Urology;  Laterality: N/A;   WISDOM TOOTH EXTRACTION     Social History   Occupational History   Not on file  Tobacco Use   Smoking status: Light Smoker    Types: Cigars    Passive exposure: Current   Smokeless tobacco: Never   Tobacco comments:    Smokes about 3-5 cigars/cigarettes per day  Vaping Use   Vaping Use: Never used  Substance and Sexual Activity   Alcohol use: Yes    Comment: social - a few a month   Drug use: Never   Sexual activity: Yes    Partners: Female

## 2023-05-21 NOTE — Progress Notes (Signed)
SDW call  Patient's wife, Camelia Eng, was given pre-op instructions over the phone. She verbalized understanding of instructions provided.    PCP - Dr. Dion Saucier Cardiologist - denies Pulmonary: denies   PPM/ICD - denies  Chest x-ray - n/a EKG -  11/30/2022 Stress Test - ECHO -  Cardiac Cath -   Sleep Study/sleep apnea/CPAP: Diagnosed with sleep apnea, does not wear a CPAP  Non-diabetic   Blood Thinner Instructions: denies Aspirin Instructions:denies   ERAS Protcol - Yes, clear fluids until 0730 PRE-SURGERY Ensure or G2-    COVID TEST- n/a    Anesthesia review: Yes.  AAA, sleep apnea, obesity   Patient denies shortness of breath, fever, cough and chest pain over the phone call  Your procedure is scheduled on Wednesday May 22, 2023  Report to [facility] Main Entrance "A" at 0800  A.M., then check in with the Admitting office.  Call this number if you have problems the morning of surgery:  [phone]   If you have any questions prior to your surgery date call [phone]: Open Monday-Friday 8am-4pm If you experience any cold or flu symptoms such as cough, fever, chills, shortness of breath, etc. between now and your scheduled surgery, please notify us at the above number    Remember:  Do not eat after midnight the night before your surgery  You may drink clear liquids until  0730 the morning of your surgery.   Clear liquids allowed are: Water, Non-Citrus Juices (without pulp), Carbonated Beverages, Clear Tea, Black Coffee ONLY (NO MILK, CREAM OR POWDERED CREAMER of any kind), and Gatorade   Take these medicines the morning of surgery with A SIP OF WATER:  Omeprazole, zoloft  As needed: Tylenol, cyclobenzaprine  As of today, STOP taking any Aspirin (unless otherwise instructed by your surgeon) Aleve, Naproxen, Ibuprofen, Motrin, Advil, Goody's, BC's, all herbal medications, fish oil, and all vitamins.

## 2023-05-21 NOTE — Anesthesia Preprocedure Evaluation (Addendum)
Anesthesia Evaluation    Reviewed: Allergy & Precautions, Patient's Chart, lab work & pertinent test results  History of Anesthesia Complications Negative for: history of anesthetic complications  Airway Mallampati: III  TM Distance: >3 FB Neck ROM: Full    Dental no notable dental hx. (+) Dental Advisory Given   Pulmonary Current Smoker and Patient abstained from smoking.   Pulmonary exam normal        Cardiovascular negative cardio ROS Normal cardiovascular exam     Neuro/Psych   Anxiety     negative neurological ROS     GI/Hepatic Neg liver ROS,GERD  Medicated,,  Endo/Other  negative endocrine ROS    Renal/GU negative Renal ROS     Musculoskeletal  (+) Arthritis ,    Abdominal   Peds  Hematology negative hematology ROS (+)   Anesthesia Other Findings   Reproductive/Obstetrics                             Anesthesia Physical Anesthesia Plan  ASA: 3  Anesthesia Plan: General   Post-op Pain Management: Tylenol PO (pre-op)* and Toradol IV (intra-op)*   Induction: Intravenous  PONV Risk Score and Plan: 3 and Ondansetron, Dexamethasone and Treatment may vary due to age or medical condition  Airway Management Planned: LMA and Oral ETT  Additional Equipment: None  Intra-op Plan:   Post-operative Plan: Extubation in OR  Informed Consent: I have reviewed the patients History and Physical, chart, labs and discussed the procedure including the risks, benefits and alternatives for the proposed anesthesia with the patient or authorized representative who has indicated his/her understanding and acceptance.     Dental advisory given  Plan Discussed with: Anesthesiologist and CRNA  Anesthesia Plan Comments: (PAT note written 05/21/2023 by Shonna Chock, PA-C.  Patient is a 67 year old male scheduled for the above procedure.   History includes smoker, GERD, AAA (3.6 cm 11/2021, 2 yr  f/u rec), dyslipidemia, pre-diabetes, OSA (mild OSA AHI 11/hr  10/26/22; does not use CPAP), anxiety, TURBT (12/03/22).    He is a same day work-up, so anesthesia team to evaluate on the day of surgery.   )       Anesthesia Quick Evaluation

## 2023-05-21 NOTE — Progress Notes (Signed)
Anesthesia Chart Review: SAME DAY WORK-UP   Case: [id number] Date/Time: 05/22/23 1020   Procedure: IRRIGATION AND DEBRIDEMENT LEFT GREAT TOE (Left)   Anesthesia type: Choice   Pre-op diagnosis: Abscess Left Foot   [facility] / [facility]   Surgeons: Nadara Mustard, MD       DISCUSSION: Patient is a 67 year old male scheduled for the above procedure.  History includes smoker, GERD, AAA (3.6 cm 11/2021, 2 yr f/u rec), dyslipidemia, pre-diabetes, OSA (mild OSA AHI 11/hr  10/26/22; does not use CPAP), anxiety, TURBT (12/03/22).   He is a same day work-up, so anesthesia team to evaluate on the day of surgery.    VS: BP Readings from Last 3 Encounters:  05/14/23 (!) 147/73  12/03/22 (!) 157/74  11/30/22 (!) 150/76   Pulse Readings from Last 3 Encounters:  05/14/23 70  12/03/22 62  11/30/22 72     PROVIDERS: Eustaquio Boyden, MD is PCP  Jetty Duhamel, MD is pulmonologist   LABS: For day of surgery as indicated. Last results in [facility] include: Lab Results  Component Value Date   WBC 9.0 05/14/2023   HGB 14.4 05/14/2023   HCT 43.8 05/14/2023   PLT 168 05/14/2023   GLUCOSE 98 05/14/2023   NA 138 05/14/2023   K 4.5 05/14/2023   CL 101 05/14/2023   CREATININE 0.81 05/14/2023   BUN 19 05/14/2023   CO2 29 05/14/2023   TSH 0.71 03/10/2020   HGBA1C 6.1 (H) 11/30/2022     IMAGES: Xray left great toe 05/14/23: IMPRESSION: Mild degenerative changes. No definite radiopaque foreign body. No fracture.   MRI Abd 12/08/21: IMPRESSION: - Liver is within normal limits on MRI. No hepatic lesion to correspond to the suspected sonographic abnormality. - Left renal cysts, benign (Bosniak I-II).   [place] Abd 11/17/21: IMPRESSION: - Hepatic steatosis. - 2.6 x 1.9 cm hypoechoic area is seen in right hepatic lobe adjacent to gallbladder fossa which may represent focal fatty infiltration, but given its well-defined margins mass cannot be excluded. Further evaluation with MRI with  intravenous contrast is recommended. - 3.6 cm infrarenal abdominal aortic aneurysm. Recommend follow-up ultrasound every 2 years. This recommendation follows ACR consensus guidelines: White Paper of the ACR Incidental Findings Committee II on Vascular Findings. J Am Coll Radiol 2013; 10:789-794.    EKG: 11/30/22: Sinus rhythm with 1st degree A-V block Left axis deviation Inferior infarct , age undetermined Anterior infarct , age undetermined Abnormal ECG No previous ECGs available Confirmed by Lance Muss [phone]) on 12/01/2022 11:12:42 AM  CV: N/A  Past Medical History:  Diagnosis Date   AAA (abdominal aortic aneurysm) (HCC) 11/20/2021   Abd [place] 11/2021 - 3.6cm infrarenal AAA - rec rpt [place] 2 yrs   Anxiety    Arthritis    COVID-19 virus infection 03/17/2021   Dyslipidemia 03/13/2020   GERD (gastroesophageal reflux disease)    Obesity, Class II, BMI 35-39.9, no comorbidity 02/22/2020   Pre-diabetes    Sleep apnea     Past Surgical History:  Procedure Laterality Date   AUTOGRAFT BONE SPINE     remote lumbar spine fusion with autograft   COLONOSCOPY  03/2020   HP, rpt 10 yrs (Nandigam)   CYSTOSCOPY WITH BIOPSY N/A 12/03/2022   Procedure: CYSTOSCOPY;  Surgeon: Jannifer Hick, MD;  Location: [facility];  Service: Urology;  Laterality: N/A;  30 MINUTES NEEDED FOR CASE  ANESTHESIA PARALYZED, INTUBATED   kidney stone removal     20+  years ago    KNEE SURGERY Left    x2 growing up - ligament tears   TONSILLECTOMY     TRANSURETHRAL RESECTION OF BLADDER TUMOR N/A 12/03/2022   Procedure: TRANSURETHRAL RESECTION OF BLADDER TUMOR (TURBT);  Surgeon: Jannifer Hick, MD;  Location: [facility];  Service: Urology;  Laterality: N/A;   WISDOM TOOTH EXTRACTION      MEDICATIONS: No current facility-administered medications for this encounter.    acetaminophen (TYLENOL) 500 MG tablet   aspirin-acetaminophen-caffeine (EXCEDRIN MIGRAINE) 250-250-65 MG tablet   Cholecalciferol (VITAMIN D3) 125  MCG (5000 UT) TABS   ciprofloxacin (CIPRO) 500 MG tablet   cyclobenzaprine (FLEXERIL) 10 MG tablet   docusate sodium (COLACE) 100 MG capsule   doxycycline (VIBRAMYCIN) 100 MG capsule   Multiple Vitamin (MULTIVITAMIN WITH MINERALS) TABS tablet   omeprazole (PRILOSEC) 40 MG capsule   oxyCODONE-acetaminophen (PERCOCET) 5-325 MG tablet   sertraline (ZOLOFT) 25 MG tablet   tamsulosin (FLOMAX) 0.4 MG CAPS capsule   vitamin C (VITAMIN C) 500 MG tablet   Zinc 22.5 MG TABS    Shonna Chock, PA-C Surgical Short Stay/Anesthesiology [facility] Phone [phone] [facility] Phone [phone] 05/21/2023 4:37 PM

## 2023-05-22 ENCOUNTER — Other Ambulatory Visit: Payer: Self-pay

## 2023-05-22 ENCOUNTER — Ambulatory Visit (HOSPITAL_COMMUNITY)
Admission: RE | Admit: 2023-05-22 | Discharge: 2023-05-22 | Disposition: A | Discharge status: Home / Self Care | Payer: Medicare Other | Attending: Orthopedic Surgery | Admitting: Orthopedic Surgery

## 2023-05-22 ENCOUNTER — Encounter (HOSPITAL_COMMUNITY): Payer: Self-pay | Admitting: Orthopedic Surgery

## 2023-05-22 ENCOUNTER — Ambulatory Visit (HOSPITAL_COMMUNITY): Payer: Medicare Other | Admitting: Vascular Surgery

## 2023-05-22 ENCOUNTER — Ambulatory Visit (HOSPITAL_BASED_OUTPATIENT_CLINIC_OR_DEPARTMENT_OTHER): Payer: Medicare Other | Admitting: Vascular Surgery

## 2023-05-22 ENCOUNTER — Encounter (HOSPITAL_COMMUNITY)
Admission: RE | Disposition: A | Discharge status: Home / Self Care | Payer: Self-pay | Source: Home / Self Care | Attending: Orthopedic Surgery

## 2023-05-22 DIAGNOSIS — F17290 Nicotine dependence, other tobacco product, uncomplicated: Secondary | ICD-10-CM | POA: Insufficient documentation

## 2023-05-22 DIAGNOSIS — L02612 Cutaneous abscess of left foot: Secondary | ICD-10-CM

## 2023-05-22 DIAGNOSIS — K219 Gastro-esophageal reflux disease without esophagitis: Secondary | ICD-10-CM | POA: Diagnosis not present

## 2023-05-22 DIAGNOSIS — Z6839 Body mass index (BMI) 39.0-39.9, adult: Secondary | ICD-10-CM | POA: Diagnosis not present

## 2023-05-22 DIAGNOSIS — E785 Hyperlipidemia, unspecified: Secondary | ICD-10-CM | POA: Diagnosis not present

## 2023-05-22 DIAGNOSIS — E669 Obesity, unspecified: Secondary | ICD-10-CM | POA: Diagnosis not present

## 2023-05-22 DIAGNOSIS — F419 Anxiety disorder, unspecified: Secondary | ICD-10-CM | POA: Insufficient documentation

## 2023-05-22 DIAGNOSIS — G4733 Obstructive sleep apnea (adult) (pediatric): Secondary | ICD-10-CM | POA: Diagnosis not present

## 2023-05-22 HISTORY — PX: I & D EXTREMITY: SHX5045

## 2023-05-22 SURGERY — IRRIGATION AND DEBRIDEMENT EXTREMITY
Anesthesia: General | Laterality: Left

## 2023-05-22 MED ORDER — LIDOCAINE HCL 1 % IJ SOLN
INTRAMUSCULAR | Status: AC
  Filled 2023-05-22: qty 20

## 2023-05-22 MED ORDER — ONDANSETRON HCL 4 MG/2ML IJ SOLN
INTRAMUSCULAR | Status: DC | PRN
  Administered 2023-05-22: 4 mg via INTRAVENOUS

## 2023-05-22 MED ORDER — PHENYLEPHRINE 80 MCG/ML (10ML) SYRINGE FOR IV PUSH (FOR BLOOD PRESSURE SUPPORT)
PREFILLED_SYRINGE | INTRAVENOUS | Status: AC
  Filled 2023-05-22: qty 10

## 2023-05-22 MED ORDER — PROPOFOL 10 MG/ML IV BOLUS
INTRAVENOUS | Status: DC | PRN
  Administered 2023-05-22: 130 mg via INTRAVENOUS

## 2023-05-22 MED ORDER — OXYCODONE-ACETAMINOPHEN 5-325 MG PO TABS: 1.0000 | ORAL_TABLET | ORAL | 0 refills | Status: DC | PRN

## 2023-05-22 MED ORDER — ONDANSETRON HCL 4 MG/2ML IJ SOLN
INTRAMUSCULAR | Status: AC
  Filled 2023-05-22: qty 2

## 2023-05-22 MED ORDER — ACETAMINOPHEN 500 MG PO TABS
1000.0000 mg | ORAL_TABLET | Freq: Once | ORAL | Status: AC
  Administered 2023-05-22: 1000 mg via ORAL
  Filled 2023-05-22: qty 2

## 2023-05-22 MED ORDER — LIDOCAINE HCL (PF) 1 % IJ SOLN
INTRAMUSCULAR | Status: DC | PRN
  Administered 2023-05-22: 10 mL

## 2023-05-22 MED ORDER — MIDAZOLAM HCL 2 MG/2ML IJ SOLN
INTRAMUSCULAR | Status: DC | PRN
  Administered 2023-05-22: 2 mg via INTRAVENOUS

## 2023-05-22 MED ORDER — CEFAZOLIN IN SODIUM CHLORIDE 3-0.9 GM/100ML-% IV SOLN
3.0000 g | INTRAVENOUS | Status: AC
  Administered 2023-05-22: 3 g via INTRAVENOUS
  Filled 2023-05-22: qty 100

## 2023-05-22 MED ORDER — FENTANYL CITRATE (PF) 250 MCG/5ML IJ SOLN
INTRAMUSCULAR | Status: DC | PRN
  Administered 2023-05-22 (×2): 50 ug via INTRAVENOUS

## 2023-05-22 MED ORDER — 0.9 % SODIUM CHLORIDE (POUR BTL) OPTIME
TOPICAL | Status: DC | PRN
  Administered 2023-05-22: 1000 mL

## 2023-05-22 MED ORDER — EPHEDRINE SULFATE-NACL 50-0.9 MG/10ML-% IV SOSY
PREFILLED_SYRINGE | INTRAVENOUS | Status: DC | PRN
  Administered 2023-05-22 (×2): 5 mg via INTRAVENOUS
  Administered 2023-05-22: 10 mg via INTRAVENOUS
  Administered 2023-05-22: 5 mg via INTRAVENOUS

## 2023-05-22 MED ORDER — CHLORHEXIDINE GLUCONATE 0.12 % MT SOLN
OROMUCOSAL | Status: AC
  Administered 2023-05-22: 15 mL via OROMUCOSAL
  Filled 2023-05-22: qty 15

## 2023-05-22 MED ORDER — CHLORHEXIDINE GLUCONATE 0.12 % MT SOLN: 15.0000 mL | Freq: Once | OROMUCOSAL | Status: AC

## 2023-05-22 MED ORDER — LACTATED RINGERS IV SOLN: INTRAVENOUS | Status: DC

## 2023-05-22 MED ORDER — MIDAZOLAM HCL 2 MG/2ML IJ SOLN
INTRAMUSCULAR | Status: AC
  Filled 2023-05-22: qty 2

## 2023-05-22 MED ORDER — PHENYLEPHRINE 80 MCG/ML (10ML) SYRINGE FOR IV PUSH (FOR BLOOD PRESSURE SUPPORT)
PREFILLED_SYRINGE | INTRAVENOUS | Status: DC | PRN
  Administered 2023-05-22: 200 ug via INTRAVENOUS
  Administered 2023-05-22 (×2): 160 ug via INTRAVENOUS

## 2023-05-22 MED ORDER — PROPOFOL 10 MG/ML IV BOLUS
INTRAVENOUS | Status: AC
  Filled 2023-05-22: qty 20

## 2023-05-22 MED ORDER — SULFAMETHOXAZOLE-TRIMETHOPRIM 800-160 MG PO TABS: 1.0000 | ORAL_TABLET | Freq: Two times a day (BID) | ORAL | 0 refills | Status: DC

## 2023-05-22 MED ORDER — ORAL CARE MOUTH RINSE: 15.0000 mL | Freq: Once | OROMUCOSAL | Status: AC

## 2023-05-22 MED ORDER — DEXAMETHASONE SODIUM PHOSPHATE 10 MG/ML IJ SOLN
INTRAMUSCULAR | Status: AC
  Filled 2023-05-22: qty 1

## 2023-05-22 MED ORDER — LIDOCAINE 2% (20 MG/ML) 5 ML SYRINGE
INTRAMUSCULAR | Status: DC | PRN
  Administered 2023-05-22: 100 mg via INTRAVENOUS

## 2023-05-22 MED ORDER — FENTANYL CITRATE (PF) 250 MCG/5ML IJ SOLN
INTRAMUSCULAR | Status: AC
  Filled 2023-05-22: qty 5

## 2023-05-22 MED ORDER — DEXAMETHASONE SODIUM PHOSPHATE 10 MG/ML IJ SOLN
INTRAMUSCULAR | Status: DC | PRN
  Administered 2023-05-22: 10 mg via INTRAVENOUS

## 2023-05-22 SURGICAL SUPPLY — 35 items
BAG COUNTER SPONGE SURGICOUNT (BAG) IMPLANT
BAG SPNG CNTER NS LX DISP (BAG)
BLADE SURG 21 STRL SS (BLADE) ×2 IMPLANT
BNDG CMPR 5X6 CHSV STRCH STRL (GAUZE/BANDAGES/DRESSINGS) ×1
BNDG COHESIVE 6X5 TAN ST LF (GAUZE/BANDAGES/DRESSINGS) IMPLANT
BNDG GAUZE DERMACEA FLUFF 4 (GAUZE/BANDAGES/DRESSINGS) ×4 IMPLANT
BNDG GZE DERMACEA 4 6PLY (GAUZE/BANDAGES/DRESSINGS) ×1
COVER SURGICAL LIGHT HANDLE (MISCELLANEOUS) ×4 IMPLANT
DRAIN PENROSE 0.25X18 (DRAIN) IMPLANT
DRAPE U-SHAPE 47X51 STRL (DRAPES) ×2 IMPLANT
DRSG ADAPTIC 3X8 NADH LF (GAUZE/BANDAGES/DRESSINGS) ×2 IMPLANT
DURAPREP 26ML APPLICATOR (WOUND CARE) ×2 IMPLANT
ELECT REM PT RETURN 9FT ADLT (ELECTROSURGICAL)
ELECTRODE REM PT RTRN 9FT ADLT (ELECTROSURGICAL) IMPLANT
GAUZE SPONGE 4X4 12PLY STRL (GAUZE/BANDAGES/DRESSINGS) ×2 IMPLANT
GLOVE BIOGEL PI IND STRL 9 (GLOVE) ×2 IMPLANT
GLOVE SURG ORTHO 9.0 STRL STRW (GLOVE) ×2 IMPLANT
GOWN STRL REUS W/ TWL XL LVL3 (GOWN DISPOSABLE) ×4 IMPLANT
GOWN STRL REUS W/TWL XL LVL3 (GOWN DISPOSABLE) ×2
HANDPIECE INTERPULSE COAX TIP (DISPOSABLE)
KIT BASIN OR (CUSTOM PROCEDURE TRAY) ×2 IMPLANT
KIT TURNOVER KIT B (KITS) ×2 IMPLANT
MANIFOLD NEPTUNE II (INSTRUMENTS) ×2 IMPLANT
NS IRRIG 1000ML POUR BTL (IV SOLUTION) ×2 IMPLANT
PACK ORTHO EXTREMITY (CUSTOM PROCEDURE TRAY) ×2 IMPLANT
PAD ABD 8X10 STRL (GAUZE/BANDAGES/DRESSINGS) IMPLANT
PAD ARMBOARD 7.5X6 YLW CONV (MISCELLANEOUS) ×4 IMPLANT
SET HNDPC FAN SPRY TIP SCT (DISPOSABLE) IMPLANT
STOCKINETTE IMPERVIOUS 9X36 MD (GAUZE/BANDAGES/DRESSINGS) IMPLANT
SUT ETHILON 2 0 PSLX (SUTURE) ×2 IMPLANT
SWAB COLLECTION DEVICE MRSA (MISCELLANEOUS) ×2 IMPLANT
SWAB CULTURE ESWAB REG 1ML (MISCELLANEOUS) IMPLANT
TOWEL GREEN STERILE (TOWEL DISPOSABLE) ×2 IMPLANT
TUBE CONNECTING 12X1/4 (SUCTIONS) ×2 IMPLANT
YANKAUER SUCT BULB TIP NO VENT (SUCTIONS) ×2 IMPLANT

## 2023-05-22 NOTE — H&P (Signed)
Brian Villanueva is an 67 y.o. male.   Chief Complaint: pain and swelling left great toe HPI: Patient is a 67 year old gentleman who is seen for initial evaluation for cellulitis left great toe secondary to penetrating trauma from a nail. Patient is seen in referral from [facility] orthopedics. Patient states he has been on antibiotics for 7 days including Cipro and doxycycline. Patient states that his tetanus is up-to-date.   Past Medical History:  Diagnosis Date   AAA (abdominal aortic aneurysm) (HCC) 11/20/2021   Abd [place] 11/2021 - 3.6cm infrarenal AAA - rec rpt [place] 2 yrs   Anxiety    Arthritis    COVID-19 virus infection 03/17/2021   Dyslipidemia 03/13/2020   GERD (gastroesophageal reflux disease)    Obesity, Class II, BMI 35-39.9, no comorbidity 02/22/2020   Pre-diabetes    Sleep apnea     Past Surgical History:  Procedure Laterality Date   AUTOGRAFT BONE SPINE     remote lumbar spine fusion with autograft   COLONOSCOPY  03/2020   HP, rpt 10 yrs (Nandigam)   CYSTOSCOPY WITH BIOPSY N/A 12/03/2022   Procedure: CYSTOSCOPY;  Surgeon: Jannifer Hick, MD;  Location: [facility];  Service: Urology;  Laterality: N/A;  30 MINUTES NEEDED FOR CASE  ANESTHESIA PARALYZED, INTUBATED   kidney stone removal     20+ years ago    KNEE SURGERY Left    x2 growing up - ligament tears   TONSILLECTOMY     TRANSURETHRAL RESECTION OF BLADDER TUMOR N/A 12/03/2022   Procedure: TRANSURETHRAL RESECTION OF BLADDER TUMOR (TURBT);  Surgeon: Jannifer Hick, MD;  Location[facility];  Service: Urology;  Laterality: N/A;   WISDOM TOOTH EXTRACTION      Family History  Problem Relation Age of Onset   Esophageal cancer Mother    High Cholesterol Father    High blood pressure Father    Atrial fibrillation Father    Arthritis Maternal Grandfather    Cancer Paternal Grandmother        unsure   Colon polyps Neg Hx    Colon cancer Neg Hx    Rectal cancer Neg Hx    Stomach cancer Neg Hx    Social History:  reports  that he has been smoking cigars. He has been exposed to tobacco smoke. He has never used smokeless tobacco. He reports current alcohol use. He reports that he does not use drugs.  Allergies: No Known Allergies  No medications prior to admission.    No results found for this or any previous visit (from the past 48 hour(s)). No results found.  Review of Systems  All other systems reviewed and are negative.   There were no vitals taken for this visit. Physical Exam  Patient is alert, oriented, no adenopathy, well-dressed, normal affect, normal respiratory effort. Examination patient has a good dorsalis pedis and posterior tibial pulse.  There is sausage digit swelling of the left great toe.  There is no ascending cellulitis no purulent drainage.  The penetrating wound has closed over.  Review of the radiographs shows no bony abnormalities shows no fluid.  Most recent hemoglobin A1c is 6.1. Assessment/Plan . Cellulitis of great toe of left foot   2. Puncture wound of great toe of left foot, initial encounter       Plan: Plan for surgical debridement on Wednesday.  Patient should finish his antibiotics tonight and this would give [place] 2 days off antibiotics to have a better chance of obtaining good  tissue cultures.  Plan for outpatient surgery.  Risks and benefits were discussed including persistent infection possible bone infection need for additional surgery.    Brian Mustard, MD 05/22/2023, 6:41 AM

## 2023-05-22 NOTE — Interval H&P Note (Signed)
History and Physical Interval Note:  05/22/2023 10:25 AM  Brian Villanueva  has presented today for surgery, with the diagnosis of Abscess Left Foot.  The various methods of treatment have been discussed with the patient and family. After consideration of risks, benefits and other options for treatment, the patient has consented to  Procedure(s): IRRIGATION AND DEBRIDEMENT LEFT GREAT TOE (Left) as a surgical intervention.  The patient's history has been reviewed, patient examined, no change in status, stable for surgery.  I have reviewed the patient's chart and labs.  Questions were answered to the patient's satisfaction.     Nadara Mustard

## 2023-05-22 NOTE — Anesthesia Procedure Notes (Signed)
Procedure Name: LMA Insertion Date/Time: 05/22/2023 10:40 AM  Performed by: Kayleen Memos, CRNAPre-anesthesia Checklist: Patient identified, Emergency Drugs available, Suction available, Patient being monitored and Timeout performed Patient Re-evaluated:Patient Re-evaluated prior to induction Oxygen Delivery Method: Circle system utilized Preoxygenation: Pre-oxygenation with 100% oxygen Induction Type: IV induction LMA: LMA inserted LMA Size: 5.0 Placement Confirmation: positive ETCO2 Dental Injury: Teeth and Oropharynx as per pre-operative assessment

## 2023-05-22 NOTE — Progress Notes (Signed)
Orthopedic Tech Progress Note Patient Details:  Brian Villanueva Feb 17, 1956 [id number] Applied post op shoe and instructed patient on how to use crutches.  Ortho Devices Type of Ortho Device: Crutches, Postop shoe/boot Ortho Device/Splint Location: LLE Ortho Device/Splint Interventions: Ordered, Application, Adjustment   Post Interventions Patient Tolerated: Well Instructions Provided: Adjustment of device, Care of device  Blase Mess 05/22/2023, 12:41 PM

## 2023-05-22 NOTE — Op Note (Signed)
05/22/2023  11:08 AM  PATIENT:  Brian Villanueva    PRE-OPERATIVE DIAGNOSIS:  Abscess Left Foot  POST-OPERATIVE DIAGNOSIS:  Same  PROCEDURE:  IRRIGATION AND DEBRIDEMENT LEFT GREAT TOE Tissue sent for cultures.  SURGEON:  Nadara Mustard, MD  PHYSICIAN ASSISTANT:None ANESTHESIA:   General  PREOPERATIVE INDICATIONS:  Brian Villanueva is a  67 y.o. male with a diagnosis of Abscess Left Foot who failed conservative measures and elected for surgical management.    The risks benefits and alternatives were discussed with the patient preoperatively including but not limited to the risks of infection, bleeding, nerve injury, cardiopulmonary complications, the need for revision surgery, among others, and the patient was willing to proceed.  OPERATIVE IMPLANTS:   * No implants in log *  @ENCIMAGES @  OPERATIVE FINDINGS: Patient had air in the soft tissue fluid and tissue sent for cultures.  OPERATIVE PROCEDURE: Patient was brought the operating room and underwent LMA anesthetic.  After adequate levels anesthesia were obtained patient's left lower extremity was prepped using DuraPrep draped into a sterile field a timeout was called.  A digital block was performed with 10 cc of 1% lidocaine plain.  A dorsal and lateral and plantar incision were made in line with the penetrating trauma.  Blunt dissection was carried through and through there was air in the soft tissue fluid fluid and soft tissue was sent for cultures.  The wound was irrigated with normal saline soft tissue was thoroughly debrided back to healthy viable tissue.  A Penrose drain was placed through and through the dorsal wound was closed using 2-0 nylon sterile dressing was applied patient was extubated taken the PACU in stable condition.   DISCHARGE PLANNING:  Antibiotic duration: Will place an order for Bactrim DS  Weightbearing: Touchdown weightbearing on the left  Pain medication: Percocet  Dressing care/ Wound VAC: Dry  dressing  Ambulatory devices: Crutches  Discharge to: Home.  Follow-up: In the office 1 week post operative.

## 2023-05-22 NOTE — Transfer of Care (Signed)
Immediate Anesthesia Transfer of Care Note  Patient: Brian Villanueva  Procedure(s) Performed: IRRIGATION AND DEBRIDEMENT LEFT GREAT TOE (Left)  Patient Location: PACU  Anesthesia Type:General  Level of Consciousness: awake, oriented, and drowsy  Airway & Oxygen Therapy: Patient Spontanous Breathing  Post-op Assessment: Report given to RN, Post -op Vital signs reviewed and stable, and Patient moving all extremities  Post vital signs: stable  Last Vitals:  Vitals Value Taken Time  BP 110/49 05/22/23 1105  Temp 36.4 C 05/22/23 1105  Pulse 77 05/22/23 1107  Resp 16 05/22/23 1107  SpO2 97 % 05/22/23 1107  Vitals shown include unvalidated device data.  Last Pain:  Vitals:   05/22/23 0825  PainSc: 5          Complications: No notable events documented.

## 2023-05-23 ENCOUNTER — Encounter: Payer: Self-pay | Admitting: Family Medicine

## 2023-05-23 NOTE — Anesthesia Postprocedure Evaluation (Signed)
Anesthesia Post Note  Patient: Brian Villanueva  Procedure(s) Performed: IRRIGATION AND DEBRIDEMENT LEFT GREAT TOE (Left)     Patient location during evaluation: PACU Anesthesia Type: General Level of consciousness: awake and alert Pain management: pain level controlled Vital Signs Assessment: post-procedure vital signs reviewed and stable Respiratory status: spontaneous breathing, nonlabored ventilation, respiratory function stable and patient connected to nasal cannula oxygen Cardiovascular status: blood pressure returned to baseline and stable Postop Assessment: no apparent nausea or vomiting Anesthetic complications: no   No notable events documented.  Last Vitals:  Vitals:   05/22/23 1130 05/22/23 1145  BP: 124/60 129/61  Pulse: 70 72  Resp: 16 19  Temp:  (!) 36.4 C  SpO2: 95% 97%    Last Pain:  Vitals:   05/22/23 0825  PainSc: 5    Pain Goal:                   [name]

## 2023-05-26 ENCOUNTER — Other Ambulatory Visit: Payer: Self-pay | Admitting: Orthopedic Surgery

## 2023-05-26 DIAGNOSIS — G4733 Obstructive sleep apnea (adult) (pediatric): Secondary | ICD-10-CM | POA: Diagnosis not present

## 2023-05-26 DIAGNOSIS — E669 Obesity, unspecified: Secondary | ICD-10-CM

## 2023-05-26 MED ORDER — CIPROFLOXACIN HCL 500 MG PO TABS
500.0000 mg | ORAL_TABLET | Freq: Two times a day (BID) | ORAL | 0 refills | Status: DC
Start: 2023-05-26 — End: 2023-06-14

## 2023-05-26 NOTE — Progress Notes (Signed)
HPI M Cigar Smoker followed for OSA, complicated by  AAA, GERD, Fatty Liver, Back Pain, hx Covid infection, Dyslipidemia, Obesity,  HST 10/26/22- AHI 11/ hr, desaturation to 82%, body weight 283 lbs  ==============================================================================     11/30/22-  66 yoM Cigar Smoker followed for OSA, complicated by  AAA, GERD, Fatty Liver, Back Pain, hx Covid infection, Dyslipidemia, Obesity,  HST 10/26/22- AHI 11/ hr, desaturation to 82%, body weight 283 lbs Epworth score-9 Body weight today-287 lbs                          Covid vax-3 Phizer Flu vax-had To discuss treatment options Here with wife. We reviewed sleep study results and treatment considerations. He was leaning toward OAP but she pressed for CPAP.  Virtual Visit via Video Note  I connected with Brian Villanueva on 05/26/23 at  1:30 PM EDT by a video enabled telemedicine application and verified that I am speaking with the correct person using two identifiers.  Location: Patient: home Provider: office   I discussed the limitations of evaluation and management by telemedicine and the availability of in person appointments. The patient expressed understanding and agreed to proceed.  History of Present Illness: 74 yoM Cigar Smoker followed for OSA, complicated by  AAA, GERD, Fatty Liver, Back Pain, hx Covid infection, Dyslipidemia, Obesity,   CPAP auto 5-15/ [facility]   ordered 11/30/22 Ortho surgery 7/10 for abscess L foot Observations/Objective: Download compliance- 83%, AHI 0.6/ hr  Assessment and Plan: Download reviewed with him.  He is satisfied with CPAP using a fullface mask and asks no changes at this time.  He indicates he is sleeping better with CPAP.  His wife came into the conversation asking to make sure we renewed with [facility].  Follow Up Instructions: We can continue CPAP auto 5-15, return visit 1 year   I discussed the assessment and treatment plan with the patient. The patient was  provided an opportunity to ask questions and all were answered. The patient agreed with the plan and demonstrated an understanding of the instructions.   The patient was advised to call back or seek an in-person evaluation if the symptoms worsen or if the condition fails to improve as anticipated.  I provided 20 minutes of non-face-to-face time during this encounter.   Jetty Duhamel, MD   ROS-see HPI   + = positive Constitutional:    weight loss, night sweats, fevers, chills, fatigue, lassitude. HEENT:    headaches, difficulty swallowing, tooth/dental problems, sore throat,       sneezing, itching, ear ache, nasal congestion, post nasal drip, snoring CV:    chest pain, orthopnea, PND, swelling in lower extremities, anasarca,                                   dizziness, palpitations Resp:   shortness of breath with exertion or at rest.                productive cough,   non-productive cough, coughing up of blood.              change in color of mucus.  wheezing.   Skin:    rash or lesions. GI:  No-   heartburn, +indigestion, abdominal pain, nausea, vomiting, diarrhea,                 change in bowel habits, loss of  appetite GU: dysuria, change in color of urine, no urgency or frequency.   flank pain. MS:   joint pain, stiffness, decreased range of motion, back pain. Neuro-     nothing unusual Psych:  change in mood or affect.  depression or anxiety.   memory loss.  OBJ- Physical Exam General- Alert, Oriented, Affect-appropriate, Distress- none acute, +big man Skin- rash-none, lesions- none, excoriation- none Lymphadenopathy- none Head- atraumatic            Eyes- Gross vision intact, PERRLA, conjunctivae and secretions clear            Ears- Hearing, canals-normal            Nose- Clear, no-Septal dev, mucus, polyps, erosion, perforation             Throat- Mallampati III-IV , mucosa clear , drainage- none, tonsils absent, +teeth Neck- flexible , trachea midline, no stridor ,  thyroid nl, carotid no bruit Chest - symmetrical excursion , unlabored           Heart/CV- RRR , no murmur , no gallop  , no rub, nl s1 s2                           - JVD- none , edema- none, stasis changes- none, varices- none           Lung- clear to P&A, wheeze- none, cough- none , dullness-none, rub- none           Chest wall-  Abd-  Br/ Gen/ Rectal- Not done, not indicated Extrem- cyanosis- none, clubbing, none, atrophy- none, strength- nl Neuro- grossly intact to observation

## 2023-05-27 LAB — AEROBIC/ANAEROBIC CULTURE W GRAM STAIN (SURGICAL/DEEP WOUND)

## 2023-05-28 ENCOUNTER — Telehealth: Payer: Medicare Other | Admitting: Internal Medicine

## 2023-05-28 ENCOUNTER — Telehealth: Payer: Self-pay | Admitting: Family Medicine

## 2023-05-28 ENCOUNTER — Encounter: Payer: Self-pay | Admitting: Internal Medicine

## 2023-05-28 DIAGNOSIS — G4733 Obstructive sleep apnea (adult) (pediatric): Secondary | ICD-10-CM

## 2023-05-28 DIAGNOSIS — E669 Obesity, unspecified: Secondary | ICD-10-CM

## 2023-05-28 NOTE — Assessment & Plan Note (Signed)
Ongoing attention to diet/ exxercise

## 2023-05-28 NOTE — Telephone Encounter (Signed)
 Opened in error

## 2023-05-28 NOTE — Assessment & Plan Note (Signed)
Benefits from CPAP with good compliance and control Plan-continue AutoPap 5-15.  [facility] will continue with supplies, masks etc. as needed.

## 2023-05-28 NOTE — Patient Instructions (Signed)
We can continue CPAP auto 5-15. [facility] will continue to provide mask, supplies etc. as needed.  I hope your foot gets better quickly.

## 2023-05-29 ENCOUNTER — Ambulatory Visit: Payer: Medicare Other | Admitting: Family

## 2023-05-30 ENCOUNTER — Ambulatory Visit: Payer: Medicare Other | Admitting: Orthopedic Surgery

## 2023-05-30 DIAGNOSIS — L03032 Cellulitis of left toe: Secondary | ICD-10-CM

## 2023-05-31 ENCOUNTER — Other Ambulatory Visit: Payer: Self-pay | Admitting: Family Medicine

## 2023-05-31 DIAGNOSIS — R400 Somnolence: Secondary | ICD-10-CM

## 2023-06-06 ENCOUNTER — Ambulatory Visit: Payer: Medicare Other | Admitting: Orthopedic Surgery

## 2023-06-06 ENCOUNTER — Telehealth: Payer: Self-pay | Admitting: Orthopedic Surgery

## 2023-06-07 ENCOUNTER — Other Ambulatory Visit: Payer: Self-pay | Admitting: Family Medicine

## 2023-06-07 DIAGNOSIS — R12 Heartburn: Secondary | ICD-10-CM

## 2023-06-10 DIAGNOSIS — G4733 Obstructive sleep apnea (adult) (pediatric): Secondary | ICD-10-CM | POA: Diagnosis not present

## 2023-06-13 ENCOUNTER — Ambulatory Visit: Payer: Medicare Other | Admitting: Orthopedic Surgery

## 2023-06-13 DIAGNOSIS — L03032 Cellulitis of left toe: Secondary | ICD-10-CM

## 2023-06-13 DIAGNOSIS — H43811 Vitreous degeneration, right eye: Secondary | ICD-10-CM | POA: Diagnosis not present

## 2023-06-13 DIAGNOSIS — H5203 Hypermetropia, bilateral: Secondary | ICD-10-CM | POA: Diagnosis not present

## 2023-06-13 DIAGNOSIS — H524 Presbyopia: Secondary | ICD-10-CM | POA: Diagnosis not present

## 2023-06-13 MED ORDER — LEVOFLOXACIN 750 MG PO TABS
750.0000 mg | ORAL_TABLET | Freq: Every day | ORAL | 0 refills | Status: DC
Start: 2023-06-13 — End: 2023-10-22

## 2023-06-14 ENCOUNTER — Encounter: Payer: Self-pay | Admitting: Family Medicine

## 2023-06-14 ENCOUNTER — Ambulatory Visit (INDEPENDENT_AMBULATORY_CARE_PROVIDER_SITE_OTHER): Payer: Medicare Other | Admitting: Family Medicine

## 2023-06-14 VITALS — BP 120/66 | HR 74 | Temp 97.7°F | Ht 74.5 in | Wt 291.2 lb

## 2023-06-14 DIAGNOSIS — Z0000 Encounter for general adult medical examination without abnormal findings: Secondary | ICD-10-CM | POA: Diagnosis not present

## 2023-06-14 DIAGNOSIS — R3129 Other microscopic hematuria: Secondary | ICD-10-CM

## 2023-06-14 DIAGNOSIS — G4733 Obstructive sleep apnea (adult) (pediatric): Secondary | ICD-10-CM

## 2023-06-14 DIAGNOSIS — I7143 Infrarenal abdominal aortic aneurysm, without rupture: Secondary | ICD-10-CM

## 2023-06-14 DIAGNOSIS — Z7189 Other specified counseling: Secondary | ICD-10-CM

## 2023-06-14 DIAGNOSIS — F439 Reaction to severe stress, unspecified: Secondary | ICD-10-CM

## 2023-06-14 DIAGNOSIS — R7303 Prediabetes: Secondary | ICD-10-CM

## 2023-06-14 DIAGNOSIS — F4323 Adjustment disorder with mixed anxiety and depressed mood: Secondary | ICD-10-CM | POA: Insufficient documentation

## 2023-06-14 DIAGNOSIS — R12 Heartburn: Secondary | ICD-10-CM | POA: Diagnosis not present

## 2023-06-14 DIAGNOSIS — E785 Hyperlipidemia, unspecified: Secondary | ICD-10-CM | POA: Diagnosis not present

## 2023-06-14 DIAGNOSIS — K760 Fatty (change of) liver, not elsewhere classified: Secondary | ICD-10-CM

## 2023-06-14 DIAGNOSIS — N329 Bladder disorder, unspecified: Secondary | ICD-10-CM

## 2023-06-14 DIAGNOSIS — E669 Obesity, unspecified: Secondary | ICD-10-CM

## 2023-06-14 LAB — URINALYSIS, ROUTINE W REFLEX MICROSCOPIC
Bilirubin Urine: NEGATIVE
Hgb urine dipstick: NEGATIVE
Ketones, ur: NEGATIVE
Leukocytes,Ua: NEGATIVE
Nitrite: NEGATIVE
Specific Gravity, Urine: 1.02 (ref 1.000–1.030)
Urine Glucose: NEGATIVE
Urobilinogen, UA: 0.2 (ref 0.0–1.0)
pH: 6.5 (ref 5.0–8.0)

## 2023-06-14 LAB — COMPREHENSIVE METABOLIC PANEL
ALT: 18 U/L (ref 0–53)
AST: 17 U/L (ref 0–37)
Albumin: 4.4 g/dL (ref 3.5–5.2)
Alkaline Phosphatase: 61 U/L (ref 39–117)
BUN: 20 mg/dL (ref 6–23)
CO2: 30 mEq/L (ref 19–32)
Calcium: 9.5 mg/dL (ref 8.4–10.5)
Chloride: 101 mEq/L (ref 96–112)
Creatinine, Ser: 0.91 mg/dL (ref 0.40–1.50)
GFR: 86.02 mL/min (ref >60.00)
Glucose, Bld: 81 mg/dL (ref 70–99)
Potassium: 4.3 mEq/L (ref 3.5–5.1)
Sodium: 138 mEq/L (ref 135–145)
Total Bilirubin: 0.4 mg/dL (ref 0.2–1.2)
Total Protein: 7.8 g/dL (ref 6.0–8.3)

## 2023-06-14 LAB — LIPID PANEL
Cholesterol: 190 mg/dL (ref 0–200)
HDL: 42 mg/dL (ref >39.00)
LDL Cholesterol: 115 mg/dL — ABNORMAL HIGH (ref 0–99)
NonHDL: 148.08
Total CHOL/HDL Ratio: 5
Triglycerides: 165 mg/dL — ABNORMAL HIGH (ref 0.0–149.0)
VLDL: 33 mg/dL (ref 0.0–40.0)

## 2023-06-14 LAB — HEMOGLOBIN A1C: Hgb A1c MFr Bld: 6.4 % (ref 4.6–6.5)

## 2023-06-14 MED ORDER — SERTRALINE HCL 50 MG PO TABS: 50.0000 mg | ORAL_TABLET | Freq: Every day | ORAL | 4 refills | Status: DC

## 2023-06-14 MED ORDER — OMEPRAZOLE 40 MG PO CPDR: 40.0000 mg | DELAYED_RELEASE_CAPSULE | Freq: Every day | ORAL | 4 refills | Status: DC

## 2023-06-14 NOTE — Assessment & Plan Note (Signed)
Now on CPAP, appreciate pulm care.

## 2023-06-14 NOTE — Assessment & Plan Note (Signed)
Continues daily PPI.  

## 2023-06-14 NOTE — Assessment & Plan Note (Signed)
Discussed weight gain noted.  Obesity complicated by comorbidities of OSA, prediabetes, dyslipidemia, fatty liver.

## 2023-06-14 NOTE — Assessment & Plan Note (Signed)
Update A1c ?

## 2023-06-14 NOTE — Assessment & Plan Note (Signed)
Advanced directive discussion - does not have. Packet previously provided. Full code, would not want prolonged life support if terminal. Would want wife to be HCPOA.

## 2023-06-14 NOTE — Assessment & Plan Note (Signed)
See above.  S/p TURBT 11/2022 with benign pathology

## 2023-06-14 NOTE — Assessment & Plan Note (Signed)
Notes difficulty with recent medical stressors and work stress. Feels sertraline 25mg  helpful - will increase to 50mg  daily.

## 2023-06-14 NOTE — Patient Instructions (Addendum)
Labs today including urinalysis Increase sertraline to 50mg  daily.  If interested, check with pharmacy about new 2 shot shingles series (shingrix), as well as RSV.  Good to see you today  Return as needed or in 1 year for next physical

## 2023-06-14 NOTE — Progress Notes (Signed)
Ph: [phone] Fax: [phone]   Patient ID: Brian Villanueva, male    DOB: [birth year], 67 y.o.   MRN: [id number]  This visit was conducted in person.  BP 120/66   Pulse 74   Temp 97.7 F (36.5 C) (Temporal)   Ht 6' 2.5" (1.892 m)   Wt 291 lb 4 oz (132.1 kg)   SpO2 97%   BMI 36.89 kg/m    CC: CPE Subjective:   HPI: Brian Villanueva is a 67 y.o. male presenting on 06/14/2023 for Annual Exam (MCR prt 2 [AWV- 04/16/23]. Pt accompanied by wife, Camelia Eng.)   Saw health advisor 04/2023 for medicare wellness visit. Note reviewed.   No results found.  Flowsheet Row Clinical Support from 04/16/2023 in [facility]  PHQ-2 Total Score 0          04/16/2023    9:12 AM 04/03/2022    8:59 AM 03/08/2021    1:16 PM 03/03/2020    9:48 AM  Fall Risk   Falls in the past year? 0 0 0 0  Number falls in past yr: 0 0 0 0  Injury with Fall? 0 0 0 0  Risk for fall due to : No Fall Risks No Fall Risks No Fall Risks No Fall Risks  Follow up Falls prevention discussed;Falls evaluation completed  Falls evaluation completed;Falls prevention discussed Falls evaluation completed;Falls prevention discussed    OSA - followed by pulm Dr Maple Hudson on autoCPAP  Bladder lesion - noted 05/2022 after hematuria, s/p TURBT 11/2022 by urology Dr Cardell Peach - benign pathology. Rec annual UA and if persistently positive consider rpt cystoscopy 3-5 yrs.   Hospitalized 05/2023 with abscess of L great toe after stepping on a nail - s/p oral abx followed by I&D in OR by Dr Lajoyce Corners. Wound culture grew pseudomonas bacteria. Saw Dr Lajoyce Corners in f/u yesterday - recurrent infection now back levaquin 750mg  course.   Preventative: Colonoscopy 03/2020 - [facility], rpt 10 yrs (Nandigam)  Prostate cancer screening - asxs. Screen yearly (PSA 11/2022: 0.3).  Lung cancer screening - not eligible Flu shot - yearly at [facility] Tdap 05/2023 COVID vaccine - pfizer 2/201, 01/2020, booster 10/2020  Pneumovax23 - 02/2020. Prevnar- 20  05/2021 Shingrix - discussed. To check with pharmacy.  Advanced directive discussion - does not have. Packet previously provided. Full code, would not want prolonged life support if terminal. Would want wife to be HCPOA.  Seat belt use discussed Sunscreen use discussed. No changing moles on skin. Has seen derm.  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 Interim medical history since our last visit reviewed. Allergies and medications reviewed and updated. Outpatient Medications Prior to Visit  Medication Sig Dispense Refill   acetaminophen (TYLENOL) 500 MG tablet Take 500-1,000 mg by mouth every 6 (six) hours as needed (pain.).     aspirin-acetaminophen-caffeine (EXCEDRIN MIGRAINE) 250-250-65 MG tablet Take 1-2 tablets by mouth every 8 (eight) hours as needed for headache.     Cholecalciferol (VITAMIN D3) 125 MCG (5000 UT) TABS Take 5,000  Units by mouth daily.     levofloxacin (LEVAQUIN) 750 MG tablet Take 1 tablet (750 mg total) by mouth daily. 20 tablet 0   Multiple Vitamin (MULTIVITAMIN WITH MINERALS) TABS tablet Take 1 tablet by mouth in the morning.     tamsulosin (FLOMAX) 0.4 MG CAPS capsule Take 0.4 mg by mouth at bedtime.     vitamin C (VITAMIN C) 500 MG tablet Take 2 tablets (1,000 mg total) by mouth daily.     Zinc 22.5 MG TABS Take 22.5 mg by mouth in the morning.     omeprazole (PRILOSEC) 40 MG capsule Take 1 capsule by mouth once daily 90 capsule 0   sertraline (ZOLOFT) 25 MG tablet Take 1 tablet by mouth once daily 30 tablet 1   ciprofloxacin (CIPRO) 500 MG tablet  Take 1 tablet (500 mg total) by mouth 2 (two) times daily. 20 tablet 0   sulfamethoxazole-trimethoprim (BACTRIM DS) 800-160 MG tablet Take 1 tablet by mouth 2 (two) times daily. 20 tablet 0   No facility-administered medications prior to visit.     Per HPI unless specifically indicated in ROS section below Review of Systems  Constitutional:  Positive for appetite change (weight gain). Negative for activity change, chills, fatigue, fever and unexpected weight change.  HENT:  Negative for hearing loss.   Eyes:  Negative for visual disturbance.  Respiratory:  Negative for cough, chest tightness, shortness of breath and wheezing.   Cardiovascular:  Negative for chest pain, palpitations and leg swelling.  Gastrointestinal:  Negative for abdominal distention, abdominal pain, blood in stool, constipation, diarrhea, nausea and vomiting.  Genitourinary:  Negative for difficulty urinating and hematuria.  Musculoskeletal:  Negative for arthralgias, myalgias and neck pain.  Skin:  Negative for rash.  Neurological:  Negative for dizziness, seizures, syncope and headaches.  Hematological:  Negative for adenopathy. Does not bruise/bleed easily.  Psychiatric/Behavioral:  Negative for dysphoric mood. The patient is not nervous/anxious.     Objective:  BP 120/66   Pulse 74   Temp 97.7 F (36.5 C) (Temporal)   Ht 6' 2.5" (1.892 m)   Wt 291 lb 4 oz (132.1 kg)   SpO2 97%   BMI 36.89 kg/m   Wt Readings from Last 3 Encounters:  06/14/23 291 lb 4 oz (132.1 kg)  05/22/23 280 lb (127 kg)  05/14/23 280 lb (127 kg)      Physical Exam Vitals and nursing note reviewed.  Constitutional:      General: He is not in acute distress.    Appearance: Normal appearance. He is well-developed. He is not ill-appearing.  HENT:     Head: Normocephalic and atraumatic.     Right Ear: Hearing, tympanic membrane, ear canal and external ear normal.     Left Ear: Hearing, tympanic membrane, ear canal and external ear  normal.     Mouth/Throat:     Mouth: Mucous membranes are moist.     Pharynx: Oropharynx is clear. No oropharyngeal exudate or posterior oropharyngeal erythema.  Eyes:     General: No scleral icterus.    Extraocular Movements: Extraocular movements intact.     Conjunctiva/sclera: Conjunctivae normal.     Pupils: Pupils are equal, round, and reactive to light.  Neck:     Thyroid: No thyroid mass or thyromegaly.     Vascular: No carotid bruit.  Cardiovascular:     Rate and Rhythm: Normal rate and regular rhythm.     Pulses: Normal pulses.  Radial pulses are 2+ on the right side and 2+ on the left side.     Heart sounds: Normal heart sounds. No murmur heard. Pulmonary:     Effort: Pulmonary effort is normal. No respiratory distress.     Breath sounds: Normal breath sounds. No wheezing, rhonchi or rales.  Abdominal:     General: Bowel sounds are normal. There is no distension.     Palpations: Abdomen is soft. There is no mass.     Tenderness: There is no abdominal tenderness. There is no guarding or rebound.     Hernia: No hernia is present.  Musculoskeletal:        General: Normal range of motion.     Cervical back: Normal range of motion and neck supple.     Right lower leg: No edema.     Left lower leg: No edema.  Lymphadenopathy:     Cervical: No cervical adenopathy.  Skin:    General: Skin is warm and dry.     Findings: No rash.  Neurological:     General: No focal deficit present.     Mental Status: He is alert and oriented to person, place, and time.  Psychiatric:        Mood and Affect: Mood normal.        Behavior: Behavior normal.        Thought Content: Thought content normal.        Judgment: Judgment normal.       Results for orders placed or performed during the hospital encounter of 05/22/23  Aerobic/Anaerobic Culture w Gram Stain (surgical/deep wound)   Specimen: Wound; Tissue  Result Value Ref Range   Specimen Description WOUND    Special  Requests NONE    Gram Stain      RARE WBC PRESENT,BOTH PMN AND MONONUCLEAR NO ORGANISMS SEEN    Culture      RARE PSEUDOMONAS AERUGINOSA NO ANAEROBES ISOLATED Performed at [facility], [address], [place], [place] [address]    Report Status 05/27/2023 FINAL    Organism ID, Bacteria PSEUDOMONAS AERUGINOSA       Susceptibility   Pseudomonas aeruginosa - MIC*    CEFTAZIDIME 4 SENSITIVE Sensitive     CIPROFLOXACIN <=0.25 SENSITIVE Sensitive     GENTAMICIN <=1 SENSITIVE Sensitive     IMIPENEM 2 SENSITIVE Sensitive     PIP/TAZO <=4 SENSITIVE Sensitive     CEFEPIME 2 SENSITIVE Sensitive     * RARE PSEUDOMONAS AERUGINOSA      04/16/2023    9:06 AM 06/08/2022   12:18 PM 04/03/2022    8:54 AM 03/08/2021    1:17 PM 03/03/2020    9:48 AM  Depression screen PHQ 2/9  Decreased Interest 0 1 0 0 0  Down, Depressed, Hopeless 0 1 0 0 0  PHQ - 2 Score 0 2 0 0 0  Altered sleeping  2  0 0  Tired, decreased energy  2  0 0  Change in appetite  0  0 0  Feeling bad or failure about yourself   1  0 0  Trouble concentrating  0  0 0  Moving slowly or fidgety/restless  1  0 0  Suicidal thoughts  0  0 0  PHQ-9 Score  8  0 0  Difficult doing work/chores  Somewhat difficult  Not difficult at all Not difficult at all       06/08/2022   12:19 PM 02/22/2020   12:19 PM  GAD 7 : Generalized Anxiety Score  Nervous, Anxious, on Edge 1 1  Control/stop worrying 0 0  Worry too much - different things 1 1  Trouble relaxing 1 1  Restless 3 1  Easily annoyed or irritable 2 1  Afraid - awful might happen 0 0  Total GAD 7 Score 8 5  Anxiety Difficulty Somewhat difficult    Assessment & Plan:   Problem List Items Addressed This Visit     Health maintenance examination - Primary (Chronic)    Preventative protocols reviewed and updated unless pt declined. Discussed healthy diet and lifestyle.       Advanced directives, counseling/discussion (Chronic)    Advanced directive discussion - does not  have. Packet previously provided. Full code, would not want prolonged life support if terminal. Would want wife to be HCPOA.       Heartburn    Continues daily PPI.       Relevant Medications   omeprazole (PRILOSEC) 40 MG capsule   Obesity, Class II, BMI 35-39.9, no comorbidity    Discussed weight gain noted.  Obesity complicated by comorbidities of OSA, prediabetes, dyslipidemia, fatty liver.       Dyslipidemia    Chronic, off medication. Update FLP (although not truly fasting).  The 10-year ASCVD risk score (Arnett DK, et al., 2019) is: 20.8%   Values used to calculate the score:     Age: 56 years     Sex: Male     Is Non-Hispanic African American: No     Diabetic: No     Tobacco smoker: Yes     Systolic Blood Pressure: 120 mmHg     Is BP treated: No     HDL Cholesterol: 42.8 mg/dL     Total Cholesterol: 211 mg/dL       Relevant Orders   Lipid panel   Comprehensive metabolic panel   Prediabetes    Update A1c.       Relevant Orders   Hemoglobin A1c   AAA (abdominal aortic aneurysm) (HCC)    Will be due for rpt [place] in 11/2023.       Fatty liver    Update LFTs.       Microhematuria    Has seen urology (Dr Cardell Peach)  Update urinalysis.  H/o hematuria noted last year, s/p reassuring TURBT.  Rec yearly UA and if persistent microhematuria, consider rpt cystoscopy in 3-5 yrs.       Relevant Orders   Urinalysis, Routine w reflex microscopic   OSA (obstructive sleep apnea)    Now on CPAP, appreciate pulm care.      Lesion of urinary bladder    See above.  S/p TURBT 11/2022 with benign pathology       Stress    Notes difficulty with recent medical stressors and work stress. Feels sertraline 25mg  helpful - will increase to 50mg  daily.       Relevant Medications   sertraline (ZOLOFT) 50 MG tablet     Meds ordered this encounter  Medications   omeprazole (PRILOSEC) 40 MG capsule    Sig: Take 1 capsule (40 mg total) by mouth daily.    Dispense:  90 capsule     Refill:  4   sertraline (ZOLOFT) 50 MG tablet    Sig: Take 1 tablet (50 mg total) by mouth daily.    Dispense:  90 tablet    Refill:  4    Orders Placed This Encounter  Procedures   Lipid panel  Comprehensive metabolic panel   Hemoglobin A1c   Urinalysis, Routine w reflex microscopic    Patient Instructions  Labs today including urinalysis Increase sertraline to 50mg  daily.  If interested, check with pharmacy about new 2 shot shingles series (shingrix), as well as RSV.  Good to see you today  Return as needed or in 1 year for next physical   Follow up plan: Return in about 1 year (around 06/13/2024) for annual exam, prior fasting for blood work, medicare wellness visit.  Eustaquio Boyden, MD

## 2023-06-14 NOTE — Assessment & Plan Note (Signed)
Will be due for rpt [place] in 11/2023.

## 2023-06-14 NOTE — Assessment & Plan Note (Signed)
Update LFT's 

## 2023-06-14 NOTE — Assessment & Plan Note (Signed)
Has seen urology (Dr Cardell Peach)  Update urinalysis.  H/o hematuria noted last year, s/p reassuring TURBT.  Rec yearly UA and if persistent microhematuria, consider rpt cystoscopy in 3-5 yrs.

## 2023-06-14 NOTE — Assessment & Plan Note (Signed)
Preventative protocols reviewed and updated unless pt declined. Discussed healthy diet and lifestyle.  

## 2023-06-14 NOTE — Assessment & Plan Note (Addendum)
Chronic, off medication. Update FLP (although not truly fasting).  The 10-year ASCVD risk score (Arnett DK, et al., 2019) is: 20.8%   Values used to calculate the score:     Age: 67 years     Sex: Male     Is Non-Hispanic African American: No     Diabetic: No     Tobacco smoker: Yes     Systolic Blood Pressure: 120 mmHg     Is BP treated: No     HDL Cholesterol: 42.8 mg/dL     Total Cholesterol: 211 mg/dL

## 2023-06-16 ENCOUNTER — Encounter: Payer: Self-pay | Admitting: Orthopedic Surgery

## 2023-06-16 NOTE — Progress Notes (Signed)
Office Visit Note   Patient: Brian Villanueva           Date of Birth: October 29, 1956           MRN: [id number] Visit Date: 05/30/2023              Requested by: Eustaquio Boyden, MD [address] [place],  [place] [address] PCP: Eustaquio Boyden, MD  Chief Complaint  Patient presents with   Left Foot - Routine Post Op    05/22/23 I&D left GT      HPI: Patient is a 67 year old gentleman who is status post irrigation debridement abscess left great toe 1 week ago.  Patient is currently on Cipro with cultures showing sensitive Pseudomonas.  Assessment & Plan: Visit Diagnoses:  1. Cellulitis of great toe of left foot     Plan: Continue protective shoe wear Dial soap cleansing  Follow-Up Instructions: Return in about 2 weeks (around 06/13/2023).   Ortho Exam  Patient is alert, oriented, no adenopathy, well-dressed, normal affect, normal respiratory effort. Examination patient has had no pain since surgery the Penrose drain is removed.  There is no purulent drainage the redness and swelling is improving.  Imaging: No results found. No images are attached to the encounter.  Labs: Lab Results  Component Value Date   HGBA1C 6.4 06/14/2023   HGBA1C 6.1 (H) 11/30/2022   HGBA1C 6.2 06/08/2022   REPTSTATUS 05/27/2023 FINAL 05/22/2023   GRAMSTAIN  05/22/2023    RARE WBC PRESENT,BOTH PMN AND MONONUCLEAR NO ORGANISMS SEEN    CULT  05/22/2023    RARE PSEUDOMONAS AERUGINOSA NO ANAEROBES ISOLATED Performed at [facility], [address], [place], [place] [address]    LABORGA PSEUDOMONAS AERUGINOSA 05/22/2023     Lab Results  Component Value Date   ALBUMIN 4.4 06/14/2023   ALBUMIN 4.8 06/08/2022   ALBUMIN 4.6 11/17/2021    No results found for: "MG" No results found for: "VD25OH"  No results found for: "PREALBUMIN"    Latest Ref Rng & Units 05/14/2023   10:25 AM 11/30/2022    8:00 AM 06/08/2022    1:01 PM  CBC EXTENDED  WBC 4.0 - 10.5 K/uL 9.0  6.3  7.2    RBC 4.22 - 5.81 MIL/uL 4.75  5.02  4.82   Hemoglobin 13.0 - 17.0 g/dL 82.9  56.2  13.0   HCT 39.0 - 52.0 % 43.8  46.1  45.0   Platelets 150 - 400 K/uL 168  175  172.0   NEUT# 1.7 - 7.7 K/uL 6.3   4.4   Lymph# 0.7 - 4.0 K/uL 1.7   2.2      There is no height or weight on file to calculate BMI.  Orders:  No orders of the defined types were placed in this encounter.  No orders of the defined types were placed in this encounter.    Procedures: No procedures performed  Clinical Data: No additional findings.  ROS:  All other systems negative, except as noted in the HPI. Review of Systems  Objective: Vital Signs: There were no vitals taken for this visit.  Specialty Comments:  No specialty comments available.  PMFS History: Patient Active Problem List   Diagnosis Date Noted   Stress 06/14/2023   Lesion of urinary bladder 12/05/2022   OSA (obstructive sleep apnea) 11/30/2022   Hematospermia 06/09/2022   Chronic right-sided thoracic back pain 06/09/2022   Daytime somnolence 06/09/2022   AAA (abdominal aortic aneurysm) (HCC) 11/20/2021  Fatty liver 11/20/2021   Liver mass 11/20/2021   Microhematuria 11/20/2021   RUQ abdominal pain 11/17/2021   Prediabetes 05/23/2021   Dyslipidemia 03/13/2020   Health maintenance examination 03/10/2020   Advanced directives, counseling/discussion 03/10/2020   Heartburn 02/22/2020   Obesity, Class II, BMI 35-39.9, no comorbidity 02/22/2020   Past Medical History:  Diagnosis Date   AAA (abdominal aortic aneurysm) (HCC) 11/20/2021   Abd [place] 11/2021 - 3.6cm infrarenal AAA - rec rpt [place] 2 yrs   Anxiety    Arthritis    COVID-19 virus infection 03/17/2021   Dyslipidemia 03/13/2020   GERD (gastroesophageal reflux disease)    Obesity, Class II, BMI 35-39.9, no comorbidity 02/22/2020   Pre-diabetes    Sleep apnea     Family History  Problem Relation Age of Onset   Esophageal cancer Mother    High Cholesterol Father    High blood  pressure Father    Atrial fibrillation Father    Arthritis Maternal Grandfather    Cancer Paternal Grandmother        unsure   Colon polyps Neg Hx    Colon cancer Neg Hx    Rectal cancer Neg Hx    Stomach cancer Neg Hx     Past Surgical History:  Procedure Laterality Date   AUTOGRAFT BONE SPINE     remote lumbar spine fusion with autograft   COLONOSCOPY  03/2020   HP, rpt 10 yrs (Nandigam)   CYSTOSCOPY WITH BIOPSY N/A 12/03/2022   Procedure: CYSTOSCOPY;  Surgeon: Jannifer Hick, MD;  Location: [facility];  Service: Urology;  Laterality: N/A;  30 MINUTES NEEDED FOR CASE  ANESTHESIA PARALYZED, INTUBATED   I & D EXTREMITY Left 05/22/2023   Procedure: IRRIGATION AND DEBRIDEMENT LEFT GREAT TOE;  Surgeon: Nadara Mustard, MD;  Location: [facility];  Service: Orthopedics;  Laterality: Left;   INCISION AND DRAINAGE ABSCESS Left 05/2023   left great toe Lajoyce Corners)   kidney stone removal     20+ years ago    KNEE SURGERY Left    x2 growing up - ligament tears   TONSILLECTOMY     TRANSURETHRAL RESECTION OF BLADDER TUMOR N/A 12/03/2022   Procedure: TRANSURETHRAL RESECTION OF BLADDER TUMOR (TURBT);  Surgeon: Jannifer Hick, MD;  Location: [facility];  Service: Urology;  Laterality: N/A;   WISDOM TOOTH EXTRACTION     Social History   Occupational History   Not on file  Tobacco Use   Smoking status: Light Smoker    Types: Cigars    Passive exposure: Current   Smokeless tobacco: Never   Tobacco comments:    Smokes about 3-5 cigars/cigarettes per day  Vaping Use   Vaping status: Never Used  Substance and Sexual Activity   Alcohol use: Yes    Comment: social - a few a month   Drug use: Never   Sexual activity: Yes    Partners: Female

## 2023-06-26 DIAGNOSIS — L02612 Cutaneous abscess of left foot: Secondary | ICD-10-CM

## 2023-06-28 ENCOUNTER — Encounter: Payer: Self-pay | Admitting: Orthopedic Surgery

## 2023-06-28 NOTE — Progress Notes (Signed)
Office Visit Note   Patient: Brian Villanueva           Date of Birth: 1956-10-12           MRN: [id number] Visit Date: 06/13/2023              Requested by: Eustaquio Boyden, MD [address] [place],  [place] [address] PCP: Eustaquio Boyden, MD  Chief Complaint  Patient presents with   Left Foot - Routine Post Op    05/22/2023 left GT I&D      HPI: Patient is a 67 year old gentleman status post irrigation and debridement left great toe 3 weeks ago.  Patient is full weightbearing in regular shoes he states he has swelling and drainage.  He has completed a course of Cipro.  Assessment & Plan: Visit Diagnoses:  1. Cellulitis of great toe of left foot     Plan: Patient is provided a new prescription for Levaquin.  He will return after his vacation.  Follow-Up Instructions: Return in about 4 weeks (around 07/11/2023).   Ortho Exam  Patient is alert, oriented, no adenopathy, well-dressed, normal affect, normal respiratory effort. Examination there is no drainage there is some redness and swelling no ascending cellulitis.  Imaging: No results found. No images are attached to the encounter.  Labs: Lab Results  Component Value Date   HGBA1C 6.4 06/14/2023   HGBA1C 6.1 (H) 11/30/2022   HGBA1C 6.2 06/08/2022   REPTSTATUS 05/27/2023 FINAL 05/22/2023   GRAMSTAIN  05/22/2023    RARE WBC PRESENT,BOTH PMN AND MONONUCLEAR NO ORGANISMS SEEN    CULT  05/22/2023    RARE PSEUDOMONAS AERUGINOSA NO ANAEROBES ISOLATED Performed at [facility], [address], [place], [place] [address]    LABORGA PSEUDOMONAS AERUGINOSA 05/22/2023     Lab Results  Component Value Date   ALBUMIN 4.4 06/14/2023   ALBUMIN 4.8 06/08/2022   ALBUMIN 4.6 11/17/2021    No results found for: "MG" No results found for: "VD25OH"  No results found for: "PREALBUMIN"    Latest Ref Rng & Units 05/14/2023   10:25 AM 11/30/2022    8:00 AM 06/08/2022    1:01 PM  CBC EXTENDED  WBC 4.0 - 10.5  K/uL 9.0  6.3  7.2   RBC 4.22 - 5.81 MIL/uL 4.75  5.02  4.82   Hemoglobin 13.0 - 17.0 g/dL 62.1  30.8  65.7   HCT 39.0 - 52.0 % 43.8  46.1  45.0   Platelets 150 - 400 K/uL 168  175  172.0   NEUT# 1.7 - 7.7 K/uL 6.3   4.4   Lymph# 0.7 - 4.0 K/uL 1.7   2.2      There is no height or weight on file to calculate BMI.  Orders:  No orders of the defined types were placed in this encounter.  Meds ordered this encounter  Medications   levofloxacin (LEVAQUIN) 750 MG tablet    Sig: Take 1 tablet (750 mg total) by mouth daily.    Dispense:  20 tablet    Refill:  0     Procedures: No procedures performed  Clinical Data: No additional findings.  ROS:  All other systems negative, except as noted in the HPI. Review of Systems  Objective: Vital Signs: There were no vitals taken for this visit.  Specialty Comments:  No specialty comments available.  PMFS History: Patient Active Problem List   Diagnosis Date Noted   Abscess of left great toe  06/26/2023   Stress 06/14/2023   Lesion of urinary bladder 12/05/2022   OSA (obstructive sleep apnea) 11/30/2022   Hematospermia 06/09/2022   Chronic right-sided thoracic back pain 06/09/2022   Daytime somnolence 06/09/2022   AAA (abdominal aortic aneurysm) (HCC) 11/20/2021   Fatty liver 11/20/2021   Liver mass 11/20/2021   Microhematuria 11/20/2021   RUQ abdominal pain 11/17/2021   Prediabetes 05/23/2021   Dyslipidemia 03/13/2020   Health maintenance examination 03/10/2020   Advanced directives, counseling/discussion 03/10/2020   Heartburn 02/22/2020   Obesity, Class II, BMI 35-39.9, no comorbidity 02/22/2020   Past Medical History:  Diagnosis Date   AAA (abdominal aortic aneurysm) (HCC) 11/20/2021   Abd [place] 11/2021 - 3.6cm infrarenal AAA - rec rpt [place] 2 yrs   Anxiety    Arthritis    COVID-19 virus infection 03/17/2021   Dyslipidemia 03/13/2020   GERD (gastroesophageal reflux disease)    Obesity, Class II, BMI 35-39.9, no  comorbidity 02/22/2020   Pre-diabetes    Sleep apnea     Family History  Problem Relation Age of Onset   Esophageal cancer Mother    High Cholesterol Father    High blood pressure Father    Atrial fibrillation Father    Arthritis Maternal Grandfather    Cancer Paternal Grandmother        unsure   Colon polyps Neg Hx    Colon cancer Neg Hx    Rectal cancer Neg Hx    Stomach cancer Neg Hx     Past Surgical History:  Procedure Laterality Date   AUTOGRAFT BONE SPINE     remote lumbar spine fusion with autograft   COLONOSCOPY  03/2020   HP, rpt 10 yrs (Nandigam)   CYSTOSCOPY WITH BIOPSY N/A 12/03/2022   Procedure: CYSTOSCOPY;  Surgeon: Jannifer Hick, MD;  Location: [facility];  Service: Urology;  Laterality: N/A;  30 MINUTES NEEDED FOR CASE  ANESTHESIA PARALYZED, INTUBATED   I & D EXTREMITY Left 05/22/2023   Procedure: IRRIGATION AND DEBRIDEMENT LEFT GREAT TOE;  Surgeon: Nadara Mustard, MD;  Location: [facility];  Service: Orthopedics;  Laterality: Left;   INCISION AND DRAINAGE ABSCESS Left 05/2023   left great toe Lajoyce Corners)   kidney stone removal     20+ years ago    KNEE SURGERY Left    x2 growing up - ligament tears   TONSILLECTOMY     TRANSURETHRAL RESECTION OF BLADDER TUMOR N/A 12/03/2022   Procedure: TRANSURETHRAL RESECTION OF BLADDER TUMOR (TURBT);  Surgeon: Jannifer Hick, MD;  Location: [facility];  Service: Urology;  Laterality: N/A;   WISDOM TOOTH EXTRACTION     Social History   Occupational History   Not on file  Tobacco Use   Smoking status: Light Smoker    Types: Cigars    Passive exposure: Current   Smokeless tobacco: Never   Tobacco comments:    Smokes about 3-5 cigars/cigarettes per day  Vaping Use   Vaping status: Never Used  Substance and Sexual Activity   Alcohol use: Yes    Comment: social - a few a month   Drug use: Never   Sexual activity: Yes    Partners: Female

## 2023-07-09 DIAGNOSIS — G4733 Obstructive sleep apnea (adult) (pediatric): Secondary | ICD-10-CM | POA: Diagnosis not present

## 2023-07-11 DIAGNOSIS — G4733 Obstructive sleep apnea (adult) (pediatric): Secondary | ICD-10-CM | POA: Diagnosis not present

## 2023-07-16 ENCOUNTER — Ambulatory Visit: Payer: Medicare Other | Admitting: Orthopedic Surgery

## 2023-07-16 DIAGNOSIS — L03032 Cellulitis of left toe: Secondary | ICD-10-CM

## 2023-07-23 ENCOUNTER — Encounter: Payer: Self-pay | Admitting: Orthopedic Surgery

## 2023-07-23 NOTE — Progress Notes (Signed)
Office Visit Note   Patient: Brian Villanueva           Date of Birth: 1956/06/28           MRN: [id number] Visit Date: 07/16/2023              Requested by: Eustaquio Boyden, MD [address] [place],  [place] [address] PCP: Eustaquio Boyden, MD  Chief Complaint  Patient presents with   Left Foot - Routine Post Op    05/22/2023 left GT I&D      HPI: The patient is a 67 year old gentleman who presents status post irrigation debridement left great toe on July 10.  Patient is full weightbearing in regular shoes.  Assessment & Plan: Visit Diagnoses:  1. Cellulitis of great toe of left foot     Plan: The wound is well-healed there is no cellulitis.  Follow-up as needed.  Follow-Up Instructions: No follow-ups on file.   Ortho Exam  Patient is alert, oriented, no adenopathy, well-dressed, normal affect, normal respiratory effort. Examination there is no cellulitis or swelling there is no tenderness to palpation or drainage.  Imaging: No results found. No images are attached to the encounter.  Labs: Lab Results  Component Value Date   HGBA1C 6.4 06/14/2023   HGBA1C 6.1 (H) 11/30/2022   HGBA1C 6.2 06/08/2022   REPTSTATUS 05/27/2023 FINAL 05/22/2023   GRAMSTAIN  05/22/2023    RARE WBC PRESENT,BOTH PMN AND MONONUCLEAR NO ORGANISMS SEEN    CULT  05/22/2023    RARE PSEUDOMONAS AERUGINOSA NO ANAEROBES ISOLATED Performed at [facility], [address], [place], [place] [address]    LABORGA PSEUDOMONAS AERUGINOSA 05/22/2023     Lab Results  Component Value Date   ALBUMIN 4.4 06/14/2023   ALBUMIN 4.8 06/08/2022   ALBUMIN 4.6 11/17/2021    No results found for: "MG" No results found for: "VD25OH"  No results found for: "PREALBUMIN"    Latest Ref Rng & Units 05/14/2023   10:25 AM 11/30/2022    8:00 AM 06/08/2022    1:01 PM  CBC EXTENDED  WBC 4.0 - 10.5 K/uL 9.0  6.3  7.2   RBC 4.22 - 5.81 MIL/uL 4.75  5.02  4.82   Hemoglobin 13.0 - 17.0 g/dL 65.7   84.6  96.2   HCT 39.0 - 52.0 % 43.8  46.1  45.0   Platelets 150 - 400 K/uL 168  175  172.0   NEUT# 1.7 - 7.7 K/uL 6.3   4.4   Lymph# 0.7 - 4.0 K/uL 1.7   2.2      There is no height or weight on file to calculate BMI.  Orders:  No orders of the defined types were placed in this encounter.  No orders of the defined types were placed in this encounter.    Procedures: No procedures performed  Clinical Data: No additional findings.  ROS:  All other systems negative, except as noted in the HPI. Review of Systems  Objective: Vital Signs: There were no vitals taken for this visit.  Specialty Comments:  No specialty comments available.  PMFS History: Patient Active Problem List   Diagnosis Date Noted   Abscess of left great toe 06/26/2023   Stress 06/14/2023   Lesion of urinary bladder 12/05/2022   OSA (obstructive sleep apnea) 11/30/2022   Hematospermia 06/09/2022   Chronic right-sided thoracic back pain 06/09/2022   Daytime somnolence 06/09/2022   AAA (abdominal aortic aneurysm) (HCC) 11/20/2021   Fatty  liver 11/20/2021   Liver mass 11/20/2021   Microhematuria 11/20/2021   RUQ abdominal pain 11/17/2021   Prediabetes 05/23/2021   Dyslipidemia 03/13/2020   Health maintenance examination 03/10/2020   Advanced directives, counseling/discussion 03/10/2020   Heartburn 02/22/2020   Obesity, Class II, BMI 35-39.9, no comorbidity 02/22/2020   Past Medical History:  Diagnosis Date   AAA (abdominal aortic aneurysm) (HCC) 11/20/2021   Abd [place] 11/2021 - 3.6cm infrarenal AAA - rec rpt [place] 2 yrs   Anxiety    Arthritis    COVID-19 virus infection 03/17/2021   Dyslipidemia 03/13/2020   GERD (gastroesophageal reflux disease)    Obesity, Class II, BMI 35-39.9, no comorbidity 02/22/2020   Pre-diabetes    Sleep apnea     Family History  Problem Relation Age of Onset   Esophageal cancer Mother    High Cholesterol Father    High blood pressure Father    Atrial fibrillation  Father    Arthritis Maternal Grandfather    Cancer Paternal Grandmother        unsure   Colon polyps Neg Hx    Colon cancer Neg Hx    Rectal cancer Neg Hx    Stomach cancer Neg Hx     Past Surgical History:  Procedure Laterality Date   AUTOGRAFT BONE SPINE     remote lumbar spine fusion with autograft   COLONOSCOPY  03/2020   HP, rpt 10 yrs (Nandigam)   CYSTOSCOPY WITH BIOPSY N/A 12/03/2022   Procedure: CYSTOSCOPY;  Surgeon: Jannifer Hick, MD;  Location: [facility];  Service: Urology;  Laterality: N/A;  30 MINUTES NEEDED FOR CASE  ANESTHESIA PARALYZED, INTUBATED   I & D EXTREMITY Left 05/22/2023   Procedure: IRRIGATION AND DEBRIDEMENT LEFT GREAT TOE;  Surgeon: Nadara Mustard, MD;  Location: [facility];  Service: Orthopedics;  Laterality: Left;   INCISION AND DRAINAGE ABSCESS Left 05/2023   left great toe Lajoyce Corners)   kidney stone removal     20+ years ago    KNEE SURGERY Left    x2 growing up - ligament tears   TONSILLECTOMY     TRANSURETHRAL RESECTION OF BLADDER TUMOR N/A 12/03/2022   Procedure: TRANSURETHRAL RESECTION OF BLADDER TUMOR (TURBT);  Surgeon: Jannifer Hick, MD;  Location: [facility];  Service: Urology;  Laterality: N/A;   WISDOM TOOTH EXTRACTION     Social History   Occupational History   Not on file  Tobacco Use   Smoking status: Light Smoker    Types: Cigars    Passive exposure: Current   Smokeless tobacco: Never   Tobacco comments:    Smokes about 3-5 cigars/cigarettes per day  Vaping Use   Vaping status: Never Used  Substance and Sexual Activity   Alcohol use: Yes    Comment: social - a few a month   Drug use: Never   Sexual activity: Yes    Partners: Female

## 2023-07-31 DIAGNOSIS — L570 Actinic keratosis: Secondary | ICD-10-CM | POA: Diagnosis not present

## 2023-07-31 DIAGNOSIS — L565 Disseminated superficial actinic porokeratosis (DSAP): Secondary | ICD-10-CM | POA: Diagnosis not present

## 2023-07-31 DIAGNOSIS — L821 Other seborrheic keratosis: Secondary | ICD-10-CM | POA: Diagnosis not present

## 2023-07-31 DIAGNOSIS — D225 Melanocytic nevi of trunk: Secondary | ICD-10-CM | POA: Diagnosis not present

## 2023-08-02 ENCOUNTER — Other Ambulatory Visit: Payer: Self-pay | Admitting: Family Medicine

## 2023-08-02 DIAGNOSIS — R400 Somnolence: Secondary | ICD-10-CM

## 2023-08-09 DIAGNOSIS — G4733 Obstructive sleep apnea (adult) (pediatric): Secondary | ICD-10-CM | POA: Diagnosis not present

## 2023-08-14 DIAGNOSIS — M1712 Unilateral primary osteoarthritis, left knee: Secondary | ICD-10-CM | POA: Diagnosis not present

## 2023-08-15 ENCOUNTER — Telehealth: Payer: Self-pay | Admitting: Family Medicine

## 2023-08-15 NOTE — Telephone Encounter (Signed)
Received clearance form form [employer]. Patient had CPE in August. Do you need to see in office for clearance? Form in your box for review.

## 2023-08-20 NOTE — Telephone Encounter (Signed)
Do recommend OV for preop evaluation.

## 2023-08-20 NOTE — Telephone Encounter (Addendum)
Spoke with pt's wife, Camelia Eng (on dpr), notifying her of form from [place] and pt needing surgical clearance. She verbalizes understanding and scheduled pre-op eval on 10/15/23 at 9:30. She will inform pt. Fyi to Dr Reece Agar.   [Dr G has clearance form.]

## 2023-09-08 DIAGNOSIS — G4733 Obstructive sleep apnea (adult) (pediatric): Secondary | ICD-10-CM | POA: Diagnosis not present

## 2023-09-25 DIAGNOSIS — M1712 Unilateral primary osteoarthritis, left knee: Secondary | ICD-10-CM | POA: Diagnosis not present

## 2023-09-25 DIAGNOSIS — M25562 Pain in left knee: Secondary | ICD-10-CM | POA: Diagnosis not present

## 2023-10-07 DIAGNOSIS — G4733 Obstructive sleep apnea (adult) (pediatric): Secondary | ICD-10-CM | POA: Diagnosis not present

## 2023-10-11 DIAGNOSIS — G4733 Obstructive sleep apnea (adult) (pediatric): Secondary | ICD-10-CM | POA: Diagnosis not present

## 2023-10-14 DIAGNOSIS — K080 Exfoliation of teeth due to systemic causes: Secondary | ICD-10-CM | POA: Diagnosis not present

## 2023-10-15 ENCOUNTER — Ambulatory Visit: Payer: Medicare Other | Admitting: Family Medicine

## 2023-10-22 ENCOUNTER — Ambulatory Visit (INDEPENDENT_AMBULATORY_CARE_PROVIDER_SITE_OTHER)
Admission: RE | Admit: 2023-10-22 | Discharge: 2023-10-22 | Disposition: A | Discharge status: Home / Self Care | Payer: Medicare Other | Source: Ambulatory Visit | Attending: Family Medicine | Admitting: Family Medicine

## 2023-10-22 ENCOUNTER — Encounter: Payer: Self-pay | Admitting: Family Medicine

## 2023-10-22 ENCOUNTER — Encounter: Payer: Self-pay | Admitting: *Deleted

## 2023-10-22 ENCOUNTER — Ambulatory Visit: Payer: Medicare Other | Admitting: Family Medicine

## 2023-10-22 VITALS — BP 128/84 | HR 74 | Temp 98.8°F | Ht 74.5 in | Wt 292.2 lb

## 2023-10-22 DIAGNOSIS — I7143 Infrarenal abdominal aortic aneurysm, without rupture: Secondary | ICD-10-CM | POA: Diagnosis not present

## 2023-10-22 DIAGNOSIS — Z01818 Encounter for other preprocedural examination: Secondary | ICD-10-CM

## 2023-10-22 DIAGNOSIS — R9431 Abnormal electrocardiogram [ECG] [EKG]: Secondary | ICD-10-CM

## 2023-10-22 DIAGNOSIS — E66812 Obesity, class 2: Secondary | ICD-10-CM | POA: Diagnosis not present

## 2023-10-22 DIAGNOSIS — R918 Other nonspecific abnormal finding of lung field: Secondary | ICD-10-CM | POA: Diagnosis not present

## 2023-10-22 DIAGNOSIS — R7303 Prediabetes: Secondary | ICD-10-CM

## 2023-10-22 DIAGNOSIS — G4733 Obstructive sleep apnea (adult) (pediatric): Secondary | ICD-10-CM

## 2023-10-22 LAB — CBC WITH DIFFERENTIAL/PLATELET
Basophils Absolute: 0.1 10*3/uL (ref 0.0–0.1)
Basophils Relative: 1.1 % (ref 0.0–3.0)
Eosinophils Absolute: 0.2 10*3/uL (ref 0.0–0.7)
Eosinophils Relative: 3 % (ref 0.0–5.0)
HCT: 45.8 % (ref 39.0–52.0)
Hemoglobin: 14.9 g/dL (ref 13.0–17.0)
Lymphocytes Relative: 26.8 % (ref 12.0–46.0)
Lymphs Abs: 1.8 10*3/uL (ref 0.7–4.0)
MCHC: 32.6 g/dL (ref 30.0–36.0)
MCV: 92 fL (ref 78.0–100.0)
Monocytes Absolute: 0.4 10*3/uL (ref 0.1–1.0)
Monocytes Relative: 6 % (ref 3.0–12.0)
Neutro Abs: 4.3 10*3/uL (ref 1.4–7.7)
Neutrophils Relative %: 63.1 % (ref 43.0–77.0)
Platelets: 191 10*3/uL (ref 150.0–400.0)
RBC: 4.98 Mil/uL (ref 4.22–5.81)
RDW: 14.2 % (ref 11.5–15.5)
WBC: 6.8 10*3/uL (ref 4.0–10.5)

## 2023-10-22 LAB — BASIC METABOLIC PANEL
BUN: 21 mg/dL (ref 6–23)
CO2: 29 meq/L (ref 19–32)
Calcium: 9.3 mg/dL (ref 8.4–10.5)
Chloride: 99 meq/L (ref 96–112)
Creatinine, Ser: 0.83 mg/dL (ref 0.40–1.50)
GFR: 89.11 mL/min (ref >60.00)
Glucose, Bld: 92 mg/dL (ref 70–99)
Potassium: 4.6 meq/L (ref 3.5–5.1)
Sodium: 138 meq/L (ref 135–145)

## 2023-10-22 LAB — HEMOGLOBIN A1C: Hgb A1c MFr Bld: 6.5 % (ref 4.6–6.5)

## 2023-10-22 NOTE — Progress Notes (Unsigned)
Ph: [phone] Fax: [phone]   Patient ID: Brian Villanueva, male    DOB: December 21, 1955, 67 y.o.   MRN: [id number]  This visit was conducted in person.  BP 128/84   Pulse 74   Temp 98.8 F (37.1 C) (Oral)   Ht 6' 2.5" (1.892 m)   Wt 292 lb 4 oz (132.6 kg)   SpO2 94%   BMI 37.02 kg/m    CC: preop eval  Subjective:   HPI: Brian Villanueva is a 67 y.o. male presenting on 10/22/2023 for Pre-op Exam (Needs L TKR, scheduled on 11/14/23. )   Brian Villanueva  has a past medical history of AAA (abdominal aortic aneurysm) (HCC) (11/20/2021), Anxiety, Arthritis, COVID-19 virus infection (03/17/2021), Dyslipidemia (03/13/2020), GERD (gastroesophageal reflux disease), Obesity, Class II, BMI 35-39.9, no comorbidity (02/22/2020), Pre-diabetes, and Sleep apnea.  Planned upcoming L total knee replacement under spinal anesthesia by Dr  Dion Saucier on 11/14/2023.   Patient has tolerated anesthesia well in the past.  Latest surgical intervention was L toe I&D under GETA by Dr Lajoyce Corners 05/2023 and prior TURBT 11/2022. Did have remote tonsillectomy, L knee surgery, and lumbar spine fusion surgery with autograft (1970s).  Denies trouble with post-op nausea/vomiting, or trouble awakening after surgery.   Denies chest pain, dyspnea, palpitations, leg swelling, HA, dizziness.  No fevers/chills, coughing, abd pain, diarrhea, or UTI symptoms.   No planned high risk surgery, no history of ischemic heart disease, no history of congestive heart failure, no history of cerebrovascular disease, no history of preoperative insulin treatment, no history of kidney disease with creatinine >2 mg/dL.   OSA followed by pulm Dr Maple Hudson on auto-CPAP, uses this regularly.  GERD - managed with PPI ~every other day.      Relevant past medical, surgical, family and social history reviewed and updated as indicated. Interim medical history since our last visit reviewed. Allergies and medications reviewed and updated. Outpatient Medications  Prior to Visit  Medication Sig Dispense Refill   acetaminophen (TYLENOL) 500 MG tablet Take 500-1,000 mg by mouth every 6 (six) hours as needed (pain.).     aspirin-acetaminophen-caffeine (EXCEDRIN MIGRAINE) 250-250-65 MG tablet Take 1-2 tablets by mouth every 8 (eight) hours as needed for headache.     Cholecalciferol (VITAMIN D3) 125 MCG (5000 UT) TABS Take 5,000 Units by mouth daily.     Multiple Vitamin (MULTIVITAMIN WITH MINERALS) TABS tablet Take 1 tablet by mouth in the morning.     omeprazole (PRILOSEC) 40 MG capsule Take 1 capsule (40 mg total) by mouth daily. 90 capsule 4   sertraline (ZOLOFT) 50 MG tablet Take 1 tablet (50 mg total) by mouth daily. 90 tablet 4   tamsulosin (FLOMAX) 0.4 MG CAPS capsule Take 0.4 mg by mouth at bedtime.     vitamin C (VITAMIN C) 500 MG tablet Take 2 tablets (1,000 mg total) by mouth daily.     Zinc 22.5 MG TABS Take 22.5 mg by mouth in the morning.     levofloxacin (LEVAQUIN) 750 MG tablet Take 1 tablet (750 mg total) by mouth daily. 20 tablet 0   No facility-administered medications prior to visit.     Per HPI unless specifically indicated in ROS section below Review of Systems  Objective:  BP 128/84   Pulse 74   Temp 98.8 F (37.1 C) (Oral)   Ht 6' 2.5" (1.892 m)   Wt 292 lb 4 oz (132.6 kg)   SpO2 94%   BMI 37.02 kg/m  Wt Readings from Last 3 Encounters:  10/22/23 292 lb 4 oz (132.6 kg)  06/14/23 291 lb 4 oz (132.1 kg)  05/22/23 280 lb (127 kg)      Physical Exam Vitals and nursing note reviewed.  Constitutional:      Appearance: Normal appearance.  HENT:     Head: Normocephalic and atraumatic.     Mouth/Throat:     Mouth: Mucous membranes are moist.     Pharynx: Oropharynx is clear. No oropharyngeal exudate or posterior oropharyngeal erythema.  Eyes:     General:        Right eye: No discharge.        Left eye: No discharge.     Extraocular Movements: Extraocular movements intact.     Conjunctiva/sclera: Conjunctivae  normal.     Pupils: Pupils are equal, round, and reactive to light.  Cardiovascular:     Rate and Rhythm: Normal rate and regular rhythm.     Pulses: Normal pulses.     Heart sounds: Normal heart sounds. No murmur heard. Pulmonary:     Effort: Pulmonary effort is normal. No respiratory distress.     Breath sounds: Normal breath sounds. No wheezing, rhonchi or rales.  Musculoskeletal:     Cervical back: Normal range of motion and neck supple.     Right lower leg: No edema.     Left lower leg: No edema.  Skin:    General: Skin is warm and dry.     Findings: No rash.  Neurological:     Mental Status: He is alert.  Psychiatric:        Mood and Affect: Mood normal.        Behavior: Behavior normal.       Results for orders placed or performed in visit on 10/22/23  Hemoglobin A1c   Collection Time: 10/22/23 10:02 AM  Result Value Ref Range   Hgb A1c MFr Bld 6.5 4.6 - 6.5 %  CBC with Differential/Platelet   Collection Time: 10/22/23 10:02 AM  Result Value Ref Range   WBC 6.8 4.0 - 10.5 K/uL   RBC 4.98 4.22 - 5.81 Mil/uL   Hemoglobin 14.9 13.0 - 17.0 g/dL   HCT 19.1 47.8 - 29.5 %   MCV 92.0 78.0 - 100.0 fl   MCHC 32.6 30.0 - 36.0 g/dL   RDW 62.1 30.8 - 65.7 %   Platelets 191.0 150.0 - 400.0 K/uL   Neutrophils Relative % 63.1 43.0 - 77.0 %   Lymphocytes Relative 26.8 12.0 - 46.0 %   Monocytes Relative 6.0 3.0 - 12.0 %   Eosinophils Relative 3.0 0.0 - 5.0 %   Basophils Relative 1.1 0.0 - 3.0 %   Neutro Abs 4.3 1.4 - 7.7 K/uL   Lymphs Abs 1.8 0.7 - 4.0 K/uL   Monocytes Absolute 0.4 0.1 - 1.0 K/uL   Eosinophils Absolute 0.2 0.0 - 0.7 K/uL   Basophils Absolute 0.1 0.0 - 0.1 K/uL  Basic metabolic panel   Collection Time: 10/22/23 10:02 AM  Result Value Ref Range   Sodium 138 135 - 145 mEq/L   Potassium 4.6 3.5 - 5.1 mEq/L   Chloride 99 96 - 112 mEq/L   CO2 29 19 - 32 mEq/L   Glucose, Bld 92 70 - 99 mg/dL   BUN 21 6 - 23 mg/dL   Creatinine, Ser 8.46 0.40 - 1.50 mg/dL    GFR 96.29 >52.84 mL/min   Calcium 9.3 8.4 - 10.5 mg/dL   Lab Results  Component Value Date   CHOL 190 06/14/2023   HDL 42.00 06/14/2023   LDLCALC 115 (H) 06/14/2023   LDLDIRECT 129.0 06/08/2022   TRIG 165.0 (H) 06/14/2023   CHOLHDL 5 06/14/2023    EKG - NSR rate 60s, normal axis, prolonged PR 1st degree AVB, no hypertrophy, q waves anterolaterally with poor R wave progression, no acute ST/T changes, overall unchanged from prior EKG 11/2022  Assessment & Plan:   Problem List Items Addressed This Visit     Obesity, Class II, BMI 35-39.9, no comorbidity   Prediabetes   Update A1c.       AAA (abdominal aortic aneurysm) (HCC)   Infrarenal AAA 3.6cm noted 11/2021 - rpt [place] 2 yrs to monitor.  Will order repeat - this need not delay planned surgical intervention.       Relevant Orders   [employer]ARE SCREENING   OSA (obstructive sleep apnea)   Doing well on nightly auto-CPAP. Planned surgical intervention is same day procedure.       Pre-op exam - Primary   RCRI = 0 Check CXR, labs today.  Abnormal EKG - see below.  However, as pt asxs from cardiac standpoint, anticipate adequately low risk to proceed with planned surgical intervention.       Relevant Orders   EKG 12-Lead (Completed)   Hemoglobin A1c (Completed)   CBC with Differential/Platelet (Completed)   Basic metabolic panel (Completed)   DG Chest 2 View   Abnormal EKG   EKG with 1st degree AV block (avoid AV nodal blocking agents) and possible q waves anterolaterally - overall unchanged from prior EKG 11/2022 - will order echocardiogram to evaluate for wall motion abnormalities.  Denies fmhx CAD/MI. No known personal cardiac history.  Consider further risk stratification with coronary calcium score.       Relevant Orders   ECHOCARDIOGRAM COMPLETE     No orders of the defined types were placed in this encounter.   Orders Placed This Encounter  Procedures   DG Chest 2 View    Standing Status:   Future     Number of Occurrences:   1    Expiration Date:   10/21/2024    Reason for Exam (SYMPTOM  OR DIAGNOSIS REQUIRED):   preop eval    Preferred imaging location?:   [facility]   [employer] MED[employer]ARE SCREENING    INSURANCE: [employer] MCR EPIC ORDER WEIGHT: 292 NO SPECIAL NEEDS (WHEELCHAIR/WALKER /OXYGEN) NO GLUCOSE MONITOR / SPINAL CORD STIMULATOR/ INJECTORS  PT AWARE OF $75 NO SHOW FEE PT AWARE TO BRING PHOTO ID & INS CARD    Standing Status:   Future    Expiration Date:   10/21/2024    Reason for Exam (SYMPTOM  OR DIAGNOSIS REQUIRED):   monitor known AAA    Preferred imaging location?:   [facility]   Hemoglobin A1c   CBC with Differential/Platelet   Basic metabolic panel   EKG 12-Lead   ECHOCARDIOGRAM COMPLETE    Standing Status:   Future    Expiration Date:   10/21/2024    Where should this test be performed:   [facility])    Does the patient weigh less than or greater than 250 lbs?:   Patient weighs greater than 250 lbs    Perflutren DEFINITY (image enhancing agent) should be administered unless hypersensitivity or allergy exist:   Administer Perflutren    Reason for exam-Echo:   Abnormal ECG  R94.31  Patient Instructions  Labs and chest xray today  You are almost due for repeat abdominal aneurysm ultrasound. I will go ahead and order this for this month prior to surgery.  [phone]  EKG was somewhat abnormal - I will order heart ultrasound for further evaluation of heart function.  [phone]  We will be in touch with results and forward to Dr Shelba Flake office.   Follow up plan: Return if symptoms worsen or fail to improve.  Eustaquio Boyden, MD

## 2023-10-22 NOTE — Assessment & Plan Note (Signed)
Infrarenal AAA 3.6cm noted 11/2021 - rpt [place] 2 yrs to monitor.  Will order this ideally to be done prior to upcoming surgery, but this need not delay planned surgical intervention.

## 2023-10-22 NOTE — Assessment & Plan Note (Addendum)
Doing well on nightly auto-CPAP. Planned surgical intervention is same day procedure.

## 2023-10-22 NOTE — Assessment & Plan Note (Signed)
Update A1c ?

## 2023-10-22 NOTE — Patient Instructions (Addendum)
Labs and chest xray today  You are almost due for repeat abdominal aneurysm ultrasound. I will go ahead and order this for this month prior to surgery.  [phone]  EKG was somewhat abnormal - I will order heart ultrasound for further evaluation of heart function.  [phone]  We will be in touch with results and forward to Dr Shelba Flake office.

## 2023-10-22 NOTE — Assessment & Plan Note (Signed)
EKG with Q waves anterolaterally - check echocardiogram to evaluate for wall motion abnormalities.

## 2023-10-22 NOTE — Assessment & Plan Note (Signed)
RCRI = 0 Check CXR, labs today.  Abnormal EKG - see below.  However, as pt asxs from cardiac standpoint, anticipate adequately low risk to proceed with planned surgical intervention.

## 2023-10-25 NOTE — Telephone Encounter (Signed)
Faxed form, OV notes and all results to [facility] at [phone], attn: Silvestre Mesi.

## 2023-10-30 ENCOUNTER — Ambulatory Visit
Admission: RE | Admit: 2023-10-30 | Discharge: 2023-10-30 | Disposition: A | Discharge status: Home / Self Care | Payer: Medicare Other | Source: Ambulatory Visit | Attending: Family Medicine | Admitting: Family Medicine

## 2023-10-30 DIAGNOSIS — I7143 Infrarenal abdominal aortic aneurysm, without rupture: Secondary | ICD-10-CM | POA: Diagnosis not present

## 2023-10-30 DIAGNOSIS — E785 Hyperlipidemia, unspecified: Secondary | ICD-10-CM | POA: Diagnosis not present

## 2023-10-30 DIAGNOSIS — G4733 Obstructive sleep apnea (adult) (pediatric): Secondary | ICD-10-CM | POA: Diagnosis not present

## 2023-10-30 DIAGNOSIS — K760 Fatty (change of) liver, not elsewhere classified: Secondary | ICD-10-CM | POA: Diagnosis not present

## 2023-11-06 DIAGNOSIS — G4733 Obstructive sleep apnea (adult) (pediatric): Secondary | ICD-10-CM | POA: Diagnosis not present

## 2023-11-11 ENCOUNTER — Other Ambulatory Visit: Payer: Self-pay

## 2023-11-14 DIAGNOSIS — M1712 Unilateral primary osteoarthritis, left knee: Secondary | ICD-10-CM | POA: Diagnosis not present

## 2023-11-14 DIAGNOSIS — G8918 Other acute postprocedural pain: Secondary | ICD-10-CM | POA: Diagnosis not present

## 2023-11-14 DIAGNOSIS — M25762 Osteophyte, left knee: Secondary | ICD-10-CM | POA: Diagnosis not present

## 2023-11-18 DIAGNOSIS — R262 Difficulty in walking, not elsewhere classified: Secondary | ICD-10-CM | POA: Diagnosis not present

## 2023-11-18 DIAGNOSIS — M1712 Unilateral primary osteoarthritis, left knee: Secondary | ICD-10-CM | POA: Diagnosis not present

## 2023-11-27 DIAGNOSIS — M1712 Unilateral primary osteoarthritis, left knee: Secondary | ICD-10-CM | POA: Diagnosis not present

## 2023-11-27 DIAGNOSIS — R262 Difficulty in walking, not elsewhere classified: Secondary | ICD-10-CM | POA: Diagnosis not present

## 2023-11-29 DIAGNOSIS — M1712 Unilateral primary osteoarthritis, left knee: Secondary | ICD-10-CM | POA: Diagnosis not present

## 2023-12-05 DIAGNOSIS — M1712 Unilateral primary osteoarthritis, left knee: Secondary | ICD-10-CM | POA: Diagnosis not present

## 2023-12-05 DIAGNOSIS — R262 Difficulty in walking, not elsewhere classified: Secondary | ICD-10-CM | POA: Diagnosis not present

## 2023-12-07 DIAGNOSIS — G4733 Obstructive sleep apnea (adult) (pediatric): Secondary | ICD-10-CM | POA: Diagnosis not present

## 2023-12-10 DIAGNOSIS — M1712 Unilateral primary osteoarthritis, left knee: Secondary | ICD-10-CM | POA: Diagnosis not present

## 2023-12-10 DIAGNOSIS — R262 Difficulty in walking, not elsewhere classified: Secondary | ICD-10-CM | POA: Diagnosis not present

## 2023-12-12 DIAGNOSIS — M1712 Unilateral primary osteoarthritis, left knee: Secondary | ICD-10-CM | POA: Diagnosis not present

## 2023-12-12 DIAGNOSIS — R262 Difficulty in walking, not elsewhere classified: Secondary | ICD-10-CM | POA: Diagnosis not present

## 2023-12-25 DIAGNOSIS — R262 Difficulty in walking, not elsewhere classified: Secondary | ICD-10-CM | POA: Diagnosis not present

## 2023-12-25 DIAGNOSIS — M1712 Unilateral primary osteoarthritis, left knee: Secondary | ICD-10-CM | POA: Diagnosis not present

## 2023-12-26 ENCOUNTER — Ambulatory Visit (HOSPITAL_COMMUNITY): Payer: Medicare Other | Attending: Family Medicine

## 2023-12-26 DIAGNOSIS — R9431 Abnormal electrocardiogram [ECG] [EKG]: Secondary | ICD-10-CM | POA: Insufficient documentation

## 2023-12-26 LAB — ECHOCARDIOGRAM COMPLETE
Area-P 1/2: 3.21 cm2
S' Lateral: 2.7 cm

## 2024-01-02 ENCOUNTER — Encounter: Payer: Self-pay | Admitting: Family Medicine

## 2024-01-02 DIAGNOSIS — I77810 Thoracic aortic ectasia: Secondary | ICD-10-CM | POA: Insufficient documentation

## 2024-01-06 DIAGNOSIS — G4733 Obstructive sleep apnea (adult) (pediatric): Secondary | ICD-10-CM | POA: Diagnosis not present

## 2024-01-07 ENCOUNTER — Other Ambulatory Visit (HOSPITAL_COMMUNITY): Payer: Medicare Other

## 2024-01-11 DIAGNOSIS — G4733 Obstructive sleep apnea (adult) (pediatric): Secondary | ICD-10-CM | POA: Diagnosis not present

## 2024-01-14 DIAGNOSIS — N401 Enlarged prostate with lower urinary tract symptoms: Secondary | ICD-10-CM | POA: Diagnosis not present

## 2024-01-14 DIAGNOSIS — Z125 Encounter for screening for malignant neoplasm of prostate: Secondary | ICD-10-CM | POA: Diagnosis not present

## 2024-01-14 DIAGNOSIS — N5201 Erectile dysfunction due to arterial insufficiency: Secondary | ICD-10-CM | POA: Diagnosis not present

## 2024-01-14 DIAGNOSIS — R3121 Asymptomatic microscopic hematuria: Secondary | ICD-10-CM | POA: Diagnosis not present

## 2024-01-14 DIAGNOSIS — R3912 Poor urinary stream: Secondary | ICD-10-CM | POA: Diagnosis not present

## 2024-02-03 DIAGNOSIS — G4733 Obstructive sleep apnea (adult) (pediatric): Secondary | ICD-10-CM | POA: Diagnosis not present

## 2024-03-05 DIAGNOSIS — G4733 Obstructive sleep apnea (adult) (pediatric): Secondary | ICD-10-CM | POA: Diagnosis not present

## 2024-03-25 ENCOUNTER — Encounter: Payer: Self-pay | Admitting: Podiatry

## 2024-03-25 ENCOUNTER — Ambulatory Visit: Admitting: Podiatry

## 2024-03-25 ENCOUNTER — Ambulatory Visit (INDEPENDENT_AMBULATORY_CARE_PROVIDER_SITE_OTHER)

## 2024-03-25 VITALS — Ht 74.5 in | Wt 292.6 lb

## 2024-03-25 DIAGNOSIS — M779 Enthesopathy, unspecified: Secondary | ICD-10-CM | POA: Diagnosis not present

## 2024-03-25 DIAGNOSIS — M722 Plantar fascial fibromatosis: Secondary | ICD-10-CM

## 2024-03-25 MED ORDER — TRIAMCINOLONE ACETONIDE 10 MG/ML IJ SUSP
10.0000 mg | Freq: Once | INTRAMUSCULAR | Status: AC
Start: 2024-03-25 — End: 2024-03-25
  Administered 2024-03-25: 10 mg via INTRA_ARTICULAR

## 2024-03-26 NOTE — Progress Notes (Signed)
 Subjective:   Patient ID: Brian Villanueva., male   DOB: 68 y.o.   MRN: [id number]   HPI Patient presents with a lot of pain in the left heel over the last few weeks and did have a knee replacement done 4 months ago and is trying to be more active.  States has been very sore especially in the morning after periods of sitting and is having balance issues   ROS      Objective:  Physical Exam  Neurovascular status was found to be intact muscle strength is adequate exquisite discomfort plantar heel left at the insertion of the tendon into the calcaneus with fluid buildup noted.  Patient is found to have good digital perfusion well oriented x 3     Assessment:  Acute plantar fasciitis left with balance issues with probability of ankle replacement external office gait process     Plan:  H&P reviewed and today I did sterile prep and I injected the plantar fascia left at insertion 3 mg Kenalog  5 mg Xylocaine  and I applied placing a fascial brace into the arch to hold up the arch try to give him more stability with the possibility long-term for orthotics  X-rays indicate small spur no indication of stress fracture or arthritis

## 2024-03-30 ENCOUNTER — Telehealth: Payer: Self-pay | Admitting: Family Medicine

## 2024-03-30 NOTE — Telephone Encounter (Signed)
 Pt wife called to request cyclobenzaprine  (flexeril ) - would like a higher dosage as he weight 290 lbs. Pt wife state if there is an issue to call [phone]

## 2024-03-30 NOTE — Telephone Encounter (Signed)
 Copied from CRM [id number]. Topic: Clinical - Medication Question >> Mar 30, 2024  3:52 PM Brian Villanueva wrote: Reason for CRM: Pt wife called to request cyclobenzaprine  (flexeril ) - would like a higher dosage as he weight 290 lbs. Pt wife state if there is an issue to call [phone]

## 2024-03-31 NOTE — Telephone Encounter (Signed)
 Spoke with pt's wife, Anselmo Kings (on drp), asking for details of rx request since Flexeril  is no longer on med list.   States pt c/o low back spasms off and on since becoming more mobile after knee surgery in 11/2023. Says he sits most of the day for work and has recently started riding the lawnmower. I offered OV but she declined at this time. States they are leaving Thurs for the weekend and just wanted to get something to help his back for the ride. Anselmo Kings will also contact Dr Ibazebo (ortho- back doc) to see what he recommends. Fyi to Dr Crissie Dome.

## 2024-04-01 MED ORDER — CYCLOBENZAPRINE HCL 10 MG PO TABS: 10.0000 mg | ORAL_TABLET | Freq: Three times a day (TID) | ORAL | 0 refills | Status: DC | PRN

## 2024-04-01 NOTE — Telephone Encounter (Signed)
 Spoke with pt's wife, Anselmo Kings (on dpr), relaying Dr Ocie Belt message. She verbalizes understanding, expresses her thanks and will inform pt.

## 2024-04-01 NOTE — Telephone Encounter (Signed)
 May try flexeril  10mg  TID PRN, with sedation precautions so don't take and drive.  This is max dose. Agree with f/u Ibazebo.

## 2024-04-01 NOTE — Addendum Note (Signed)
 Addended by: Claire Crick on: 04/01/2024 06:54 AM   Modules accepted: Orders

## 2024-04-05 DIAGNOSIS — G4733 Obstructive sleep apnea (adult) (pediatric): Secondary | ICD-10-CM | POA: Diagnosis not present

## 2024-04-09 ENCOUNTER — Encounter: Payer: Self-pay | Admitting: Podiatry

## 2024-04-09 ENCOUNTER — Ambulatory Visit: Admitting: Podiatry

## 2024-04-09 VITALS — Ht 74.5 in | Wt 293.0 lb

## 2024-04-09 DIAGNOSIS — M722 Plantar fascial fibromatosis: Secondary | ICD-10-CM | POA: Diagnosis not present

## 2024-04-09 MED ORDER — TRIAMCINOLONE ACETONIDE 10 MG/ML IJ SUSP
10.0000 mg | Freq: Once | INTRAMUSCULAR | Status: AC
Start: 2024-04-09 — End: 2024-04-09
  Administered 2024-04-09: 10 mg via INTRA_ARTICULAR

## 2024-04-09 MED ORDER — DICLOFENAC SODIUM 75 MG PO TBEC
75.0000 mg | DELAYED_RELEASE_TABLET | Freq: Two times a day (BID) | ORAL | 2 refills | Status: DC
Start: 2024-04-09 — End: 2024-06-18

## 2024-04-10 NOTE — Progress Notes (Signed)
 Subjective:   Patient ID: Brian Villanueva., male   DOB: 68 y.o.   MRN: [id number]   HPI Patient states he still having a lot of pain in his left heel and is walking somewhat different because of his knee replacement of over 4 months ago   ROS      Objective:  Physical Exam  Neurovascular status intact exquisite discomfort still noted medial fascial band left at the insertional point tendon calcaneus fluid buildup noted with abnormal gait pattern with pain worse when he gets up in the morning after periods of sitting     Assessment:  Acute plantar fasciitis left so far has not responded with the patient who has had an alteration of gait secondary to recent knee replacement     Plan:  8P reviewed discussed alteration of gait and I do think that custom orthotics to realign his gait will be of benefit and he is to be referred to pedorthist to have this done.  I did go ahead today I did sterile prep injected the plantar fascia left 3 mg Kenalog  5 mg Xylocaine  and sterile dressing and I dispensed night splint to start to work to stretch his foot along with aggressive ice therapy and will be seen back for casting in the next several weeks

## 2024-04-12 DIAGNOSIS — G4733 Obstructive sleep apnea (adult) (pediatric): Secondary | ICD-10-CM | POA: Diagnosis not present

## 2024-04-24 DIAGNOSIS — M1712 Unilateral primary osteoarthritis, left knee: Secondary | ICD-10-CM | POA: Diagnosis not present

## 2024-05-05 ENCOUNTER — Other Ambulatory Visit

## 2024-05-06 DIAGNOSIS — G4733 Obstructive sleep apnea (adult) (pediatric): Secondary | ICD-10-CM | POA: Diagnosis not present

## 2024-06-05 DIAGNOSIS — G4733 Obstructive sleep apnea (adult) (pediatric): Secondary | ICD-10-CM | POA: Diagnosis not present

## 2024-06-07 ENCOUNTER — Other Ambulatory Visit: Payer: Self-pay | Admitting: Family Medicine

## 2024-06-07 DIAGNOSIS — K760 Fatty (change of) liver, not elsewhere classified: Secondary | ICD-10-CM

## 2024-06-07 DIAGNOSIS — E785 Hyperlipidemia, unspecified: Secondary | ICD-10-CM

## 2024-06-07 DIAGNOSIS — R3129 Other microscopic hematuria: Secondary | ICD-10-CM

## 2024-06-07 DIAGNOSIS — R7303 Prediabetes: Secondary | ICD-10-CM

## 2024-06-07 DIAGNOSIS — Z125 Encounter for screening for malignant neoplasm of prostate: Secondary | ICD-10-CM

## 2024-06-11 ENCOUNTER — Other Ambulatory Visit: Payer: Medicare Other

## 2024-06-11 ENCOUNTER — Other Ambulatory Visit (INDEPENDENT_AMBULATORY_CARE_PROVIDER_SITE_OTHER): Payer: Medicare Other

## 2024-06-11 DIAGNOSIS — K760 Fatty (change of) liver, not elsewhere classified: Secondary | ICD-10-CM

## 2024-06-11 DIAGNOSIS — E785 Hyperlipidemia, unspecified: Secondary | ICD-10-CM

## 2024-06-11 DIAGNOSIS — R7303 Prediabetes: Secondary | ICD-10-CM | POA: Diagnosis not present

## 2024-06-11 DIAGNOSIS — Z125 Encounter for screening for malignant neoplasm of prostate: Secondary | ICD-10-CM | POA: Diagnosis not present

## 2024-06-11 DIAGNOSIS — R3129 Other microscopic hematuria: Secondary | ICD-10-CM

## 2024-06-11 LAB — URINALYSIS, ROUTINE W REFLEX MICROSCOPIC
Bilirubin Urine: NEGATIVE
Ketones, ur: NEGATIVE
Leukocytes,Ua: NEGATIVE
Nitrite: NEGATIVE
Specific Gravity, Urine: 1.015 (ref 1.000–1.030)
Total Protein, Urine: NEGATIVE
Urine Glucose: NEGATIVE
Urobilinogen, UA: 0.2 (ref 0.0–1.0)
pH: 6 (ref 5.0–8.0)

## 2024-06-11 LAB — LIPID PANEL
Cholesterol: 202 mg/dL — ABNORMAL HIGH (ref 0–200)
HDL: 39.5 mg/dL (ref >39.00)
LDL Cholesterol: 132 mg/dL — ABNORMAL HIGH (ref 0–99)
NonHDL: 162.79
Total CHOL/HDL Ratio: 5
Triglycerides: 152 mg/dL — ABNORMAL HIGH (ref 0.0–149.0)
VLDL: 30.4 mg/dL (ref 0.0–40.0)

## 2024-06-11 LAB — CBC WITH DIFFERENTIAL/PLATELET
Basophils Absolute: 0 K/uL (ref 0.0–0.1)
Basophils Relative: 0.8 % (ref 0.0–3.0)
Eosinophils Absolute: 0.2 K/uL (ref 0.0–0.7)
Eosinophils Relative: 3.4 % (ref 0.0–5.0)
HCT: 44.9 % (ref 39.0–52.0)
Hemoglobin: 14.8 g/dL (ref 13.0–17.0)
Lymphocytes Relative: 27.8 % (ref 12.0–46.0)
Lymphs Abs: 1.7 K/uL (ref 0.7–4.0)
MCHC: 32.9 g/dL (ref 30.0–36.0)
MCV: 89 fl (ref 78.0–100.0)
Monocytes Absolute: 0.3 K/uL (ref 0.1–1.0)
Monocytes Relative: 4.3 % (ref 3.0–12.0)
Neutro Abs: 3.8 K/uL (ref 1.4–7.7)
Neutrophils Relative %: 63.7 % (ref 43.0–77.0)
Platelets: 208 K/uL (ref 150.0–400.0)
RBC: 5.05 Mil/uL (ref 4.22–5.81)
RDW: 16.1 % — ABNORMAL HIGH (ref 11.5–15.5)
WBC: 6 K/uL (ref 4.0–10.5)

## 2024-06-11 LAB — COMPREHENSIVE METABOLIC PANEL WITH GFR
ALT: 20 U/L (ref 0–53)
AST: 17 U/L (ref 0–37)
Albumin: 4.5 g/dL (ref 3.5–5.2)
Alkaline Phosphatase: 68 U/L (ref 39–117)
BUN: 28 mg/dL — ABNORMAL HIGH (ref 6–23)
CO2: 31 meq/L (ref 19–32)
Calcium: 9 mg/dL (ref 8.4–10.5)
Chloride: 101 meq/L (ref 96–112)
Creatinine, Ser: 0.94 mg/dL (ref 0.40–1.50)
GFR: 82.16 mL/min (ref >60.00)
Glucose, Bld: 107 mg/dL — ABNORMAL HIGH (ref 70–99)
Potassium: 4.6 meq/L (ref 3.5–5.1)
Sodium: 139 meq/L (ref 135–145)
Total Bilirubin: 0.5 mg/dL (ref 0.2–1.2)
Total Protein: 7.6 g/dL (ref 6.0–8.3)

## 2024-06-11 LAB — HEMOGLOBIN A1C: Hgb A1c MFr Bld: 6.5 % (ref 4.6–6.5)

## 2024-06-11 LAB — PSA: PSA: 0.16 ng/mL (ref 0.10–4.00)

## 2024-06-13 ENCOUNTER — Ambulatory Visit: Payer: Self-pay | Admitting: Family Medicine

## 2024-06-17 ENCOUNTER — Encounter: Payer: Self-pay | Admitting: Family Medicine

## 2024-06-17 ENCOUNTER — Ambulatory Visit: Payer: Medicare Other | Admitting: Family Medicine

## 2024-06-17 VITALS — BP 140/82 | HR 58 | Temp 98.1°F | Ht 74.5 in | Wt 288.4 lb

## 2024-06-17 DIAGNOSIS — I7143 Infrarenal abdominal aortic aneurysm, without rupture: Secondary | ICD-10-CM

## 2024-06-17 DIAGNOSIS — Z7189 Other specified counseling: Secondary | ICD-10-CM

## 2024-06-17 DIAGNOSIS — N329 Bladder disorder, unspecified: Secondary | ICD-10-CM

## 2024-06-17 DIAGNOSIS — Z0000 Encounter for general adult medical examination without abnormal findings: Secondary | ICD-10-CM

## 2024-06-17 DIAGNOSIS — F439 Reaction to severe stress, unspecified: Secondary | ICD-10-CM

## 2024-06-17 DIAGNOSIS — E785 Hyperlipidemia, unspecified: Secondary | ICD-10-CM

## 2024-06-17 DIAGNOSIS — K219 Gastro-esophageal reflux disease without esophagitis: Secondary | ICD-10-CM

## 2024-06-17 DIAGNOSIS — I77810 Thoracic aortic ectasia: Secondary | ICD-10-CM

## 2024-06-17 DIAGNOSIS — G4733 Obstructive sleep apnea (adult) (pediatric): Secondary | ICD-10-CM

## 2024-06-17 DIAGNOSIS — E1169 Type 2 diabetes mellitus with other specified complication: Secondary | ICD-10-CM

## 2024-06-17 DIAGNOSIS — E66812 Obesity, class 2: Secondary | ICD-10-CM

## 2024-06-17 DIAGNOSIS — L237 Allergic contact dermatitis due to plants, except food: Secondary | ICD-10-CM

## 2024-06-17 DIAGNOSIS — F4323 Adjustment disorder with mixed anxiety and depressed mood: Secondary | ICD-10-CM

## 2024-06-17 DIAGNOSIS — R12 Heartburn: Secondary | ICD-10-CM

## 2024-06-17 MED ORDER — ATORVASTATIN CALCIUM 20 MG PO TABS: 20.0000 mg | ORAL_TABLET | Freq: Every day | ORAL | 3 refills | Status: AC

## 2024-06-17 MED ORDER — SERTRALINE HCL 50 MG PO TABS: 50.0000 mg | ORAL_TABLET | Freq: Every day | ORAL | 3 refills | Status: AC

## 2024-06-17 MED ORDER — CLOBETASOL PROPIONATE 0.05 % EX CREA
1.0000 | TOPICAL_CREAM | Freq: Two times a day (BID) | CUTANEOUS | 0 refills | Status: AC
Start: 2024-06-17 — End: 2025-06-17

## 2024-06-17 MED ORDER — OMEPRAZOLE 40 MG PO CPDR: 40.0000 mg | DELAYED_RELEASE_CAPSULE | Freq: Every day | ORAL | 3 refills | Status: AC

## 2024-06-17 MED ORDER — CYCLOBENZAPRINE HCL 10 MG PO TABS: 10.0000 mg | ORAL_TABLET | Freq: Three times a day (TID) | ORAL | 0 refills | Status: AC | PRN

## 2024-06-17 NOTE — Assessment & Plan Note (Signed)
 Chronic off med.  Reviewed elevated ASCVD risk. Start atorvastatin  20mg  daily, monitoring for side effects.  The 10-year ASCVD risk score (Arnett DK, et al., 2019) is: 46.5%   Values used to calculate the score:     Age: 68 years     Clincally relevant sex: Male     Is Non-Hispanic African American: No     Diabetic: Yes     Tobacco smoker: Yes     Systolic Blood Pressure: 140 mmHg     Is BP treated: No     HDL Cholesterol: 39.5 mg/dL     Total Cholesterol: 202 mg/dL

## 2024-06-17 NOTE — Assessment & Plan Note (Signed)
 Preventative protocols reviewed and updated unless pt declined. Discussed healthy diet and lifestyle.

## 2024-06-17 NOTE — Assessment & Plan Note (Addendum)
 Advanced directive discussion - does not have. Packet previously provided. Full code, would not want prolonged life support if terminal. Would want wife to be HCPOA. encouraged he set this up.

## 2024-06-17 NOTE — Assessment & Plan Note (Signed)
 New, diet controlled Recommend low sugar low carb diabetic diet - handout on diabetes and nutrition provided.  Rec yearly eye exam.  RTC 6 mo DM f/u visit.

## 2024-06-17 NOTE — Progress Notes (Unsigned)
 Ph: [phone] Fax: [phone]   Patient ID: Brian Villanueva., male    DOB: 1956-09-17, 68 y.o.   MRN: [id number]  This visit was conducted in person.  BP (!) 140/82   Pulse (!) 58   Temp 98.1 F (36.7 C) (Oral)   Ht 6' 2.5 (1.892 m)   Wt 288 lb 6 oz (130.8 kg)   SpO2 93%   BMI 36.53 kg/m    CC: CPE/AMW Subjective:   HPI: Brian Villanueva. is a 68 y.o. male presenting on 06/17/2024 for Annual Exam (Pt accompanied by wife Veva)   Did not see health advisor this year.  No results found.  Flowsheet Row Office Visit from 06/17/2024 in [facility] [place]  PHQ-2 Total Score 1       06/17/2024   10:13 AM 10/22/2023   10:11 AM 04/16/2023    9:12 AM 04/03/2022    8:59 AM 03/08/2021    1:16 PM  Fall Risk   Falls in the past year? 0 0 0 0 0  Number falls in past yr: 0  0 0 0  Injury with Fall? 0  0 0 0  Risk for fall due to :   No Fall Risks No Fall Risks No Fall Risks  Follow up Falls evaluation completed  Falls prevention discussed;Falls evaluation completed  Falls evaluation completed;Falls prevention discussed      Data saved with a previous flowsheet row definition   S/p L knee replacement 11/2023 [place])   OSA - sees pulm Dr Neysa on autoCPAP   Bladder lesion - noted 05/2022 after hematuria, s/p TURBT 11/2022 by urology Dr Selma - benign pathology. Continues seeing uro yearly - rec annual UA and if persistently positive consider rpt cystoscopy 3-5 yrs.   Got into [place] 2 nights ago - L medial ankle into achilles area. Very itchy. Requests stronger cream - OTC cortizone-10 not helping  Preventative: Colonoscopy 03/2020 - [facility], rpt 10 yrs (Nandigam)  Prostate cancer screening - asxs. Screen yearly through urology  Lung cancer screening - not eligible Flu shot - yearly at [facility] Tdap 05/2023 COVID vaccine - pfizer 2/201, 01/2020, booster 10/2020  Pneumovax23 - 02/2020. Prevnar- 20 05/2021  Shingrix - discussed. To check with pharmacy.   Advanced directive discussion - does not have. Packet previously provided. Full code, would not want prolonged life support if terminal. Would want wife to be HCPOA.  Seat belt use discussed  Sunscreen use discussed. No changing moles on skin. Has seen derm Bonney). 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 Interim medical history since our last visit reviewed. Allergies and medications reviewed and updated. Outpatient Medications Prior to Visit  Medication Sig Dispense Refill   acetaminophen  (TYLENOL ) 500 MG tablet Take 500-1,000 mg by mouth every 6 (six) hours as needed (pain.).     aspirin-acetaminophen -caffeine (EXCEDRIN MIGRAINE) 250-250-65 MG tablet Take 1-2 tablets by mouth every 8 (eight) hours as needed for headache.     Cholecalciferol (VITAMIN D3) 125 MCG (5000 UT) TABS Take 5,000 Units by mouth daily.  diclofenac  (VOLTAREN ) 75 MG EC tablet Take 1 tablet (75 mg total) by mouth 2 (two) times daily. 50 tablet 2   meloxicam (MOBIC) 15 MG tablet Take 15 mg by mouth daily.     Multiple Vitamin (MULTIVITAMIN WITH MINERALS) TABS tablet Take 1 tablet by mouth in the morning.     tamsulosin (FLOMAX) 0.4 MG CAPS capsule Take 0.4 mg by mouth at bedtime.     vitamin C (VITAMIN C) 500 MG tablet Take 2 tablets (1,000 mg total) by mouth daily.     Zinc 22.5 MG TABS Take 22.5 mg by mouth in the morning.     cyclobenzaprine  (FLEXERIL ) 10 MG tablet Take 1 tablet (10 mg total) by mouth 3 (three) times daily as needed for muscle spasms (sedation precautions). 30 tablet 0   omeprazole  (PRILOSEC) 40 MG  capsule Take 1 capsule (40 mg total) by mouth daily. 90 capsule 4   sertraline  (ZOLOFT ) 50 MG tablet Take 1 tablet (50 mg total) by mouth daily. 90 tablet 4   No facility-administered medications prior to visit.     Per HPI unless specifically indicated in ROS section below Review of Systems  Constitutional:  Negative for activity change, appetite change, chills, fatigue, fever and unexpected weight change.  HENT:  Negative for hearing loss.   Eyes:  Negative for visual disturbance.  Respiratory:  Positive for cough (recent, now better). Negative for chest tightness, shortness of breath and wheezing.   Cardiovascular:  Negative for chest pain, palpitations and leg swelling.  Gastrointestinal:  Negative for abdominal distention, abdominal pain, blood in stool, constipation, diarrhea, nausea and vomiting.  Genitourinary:  Negative for difficulty urinating and hematuria.  Musculoskeletal:  Negative for arthralgias, myalgias and neck pain.  Skin:  Negative for rash.  Neurological:  Negative for dizziness, seizures, syncope and headaches.  Hematological:  Negative for adenopathy. Does not bruise/bleed easily.  Psychiatric/Behavioral:  Negative for dysphoric mood. The patient is not nervous/anxious.     Objective:  BP (!) 140/82   Pulse (!) 58   Temp 98.1 F (36.7 C) (Oral)   Ht 6' 2.5 (1.892 m)   Wt 288 lb 6 oz (130.8 kg)   SpO2 93%   BMI 36.53 kg/m   Wt Readings from Last 3 Encounters:  06/17/24 288 lb 6 oz (130.8 kg)  04/09/24 293 lb (132.9 kg)  03/25/24 292 lb 9.3 oz (132.7 kg)      Physical Exam Vitals and nursing note reviewed.  Constitutional:      General: He is not in acute distress.    Appearance: Normal appearance. He is well-developed. He is not ill-appearing.  HENT:     Head: Normocephalic and atraumatic.     Right Ear: Hearing, tympanic membrane, ear canal and external ear normal.     Left Ear: Hearing, tympanic membrane, ear canal and external ear normal.      Mouth/Throat:     Mouth: Mucous membranes are moist.     Pharynx: Oropharynx is clear. No oropharyngeal exudate or posterior oropharyngeal erythema.  Eyes:     General: No scleral icterus.    Extraocular Movements: Extraocular movements intact.     Conjunctiva/sclera: Conjunctivae normal.     Pupils: Pupils are equal, round, and reactive to light.  Neck:     Thyroid : No thyroid  mass or thyromegaly.     Vascular: No carotid bruit.  Cardiovascular:     Rate and Rhythm: Normal rate and regular rhythm.     Pulses: Normal pulses.  Radial pulses are 2+ on the right side and 2+ on the left side.     Heart sounds: Normal heart sounds. No murmur heard. Pulmonary:     Effort: Pulmonary effort is normal. No respiratory distress.     Breath sounds: Normal breath sounds. No wheezing, rhonchi or rales.  Abdominal:     General: Bowel sounds are normal. There is no distension.     Palpations: Abdomen is soft. There is no mass.     Tenderness: There is no abdominal tenderness. There is no guarding or rebound.     Hernia: No hernia is present.  Musculoskeletal:        General: Normal range of motion.     Cervical back: Normal range of motion and neck supple.     Right lower leg: No edema.     Left lower leg: No edema.  Lymphadenopathy:     Cervical: No cervical adenopathy.  Skin:    General: Skin is warm and dry.     Findings: No rash.  Neurological:     General: No focal deficit present.     Mental Status: He is alert and oriented to person, place, and time.     Comments:  Recall 1/3, 2/3 with cue Calculation 5/5 DLROW  Psychiatric:        Mood and Affect: Mood normal.        Behavior: Behavior normal.        Thought Content: Thought content normal.        Judgment: Judgment normal.       Results for orders placed or performed in visit on 06/11/24  Urinalysis, Routine w reflex microscopic   Collection Time: 06/11/24  8:23 AM  Result Value Ref Range   Color, Urine YELLOW  Yellow;Lt. Yellow;Straw;Dark Yellow;Amber;Green;Red;Brown   APPearance CLEAR Clear;Turbid;Slightly Cloudy;Cloudy   Specific Gravity, Urine 1.015 1.000 - 1.030   pH 6.0 5.0 - 8.0   Total Protein, Urine NEGATIVE Negative   Urine Glucose NEGATIVE Negative   Ketones, ur NEGATIVE Negative   Bilirubin Urine NEGATIVE Negative   Hgb urine dipstick SMALL (A) Negative   Urobilinogen, UA 0.2 0.0 - 1.0   Leukocytes,Ua NEGATIVE Negative   Nitrite NEGATIVE Negative   WBC, UA 0-2/hpf 0-2/hpf   RBC / HPF 11-20/hpf (A) 0-2/hpf   Mucus, UA Presence of (A) None   Squamous Epithelial / HPF Rare(0-4/hpf) Rare(0-4/hpf)   Hyaline Casts, UA Presence of (A) None  PSA   Collection Time: 06/11/24  8:23 AM  Result Value Ref Range   PSA 0.16 0.10 - 4.00 ng/mL  Hemoglobin A1c   Collection Time: 06/11/24  8:23 AM  Result Value Ref Range   Hgb A1c MFr Bld 6.5 4.6 - 6.5 %  CBC with Differential/Platelet   Collection Time: 06/11/24  8:23 AM  Result Value Ref Range   WBC 6.0 4.0 - 10.5 K/uL   RBC 5.05 4.22 - 5.81 Mil/uL   Hemoglobin 14.8 13.0 - 17.0 g/dL   HCT 55.0 60.9 - 47.9 %   MCV 89.0 78.0 - 100.0 fl   MCHC 32.9 30.0 - 36.0 g/dL   RDW 83.8 (H) 88.4 - 84.4 %   Platelets 208.0 150.0 - 400.0 K/uL   Neutrophils Relative % 63.7 43.0 - 77.0 %   Lymphocytes Relative 27.8 12.0 - 46.0 %   Monocytes Relative 4.3 3.0 - 12.0 %   Eosinophils Relative 3.4 0.0 - 5.0 %   Basophils Relative 0.8 0.0 - 3.0 %  Neutro Abs 3.8 1.4 - 7.7 K/uL   Lymphs Abs 1.7 0.7 - 4.0 K/uL   Monocytes Absolute 0.3 0.1 - 1.0 K/uL   Eosinophils Absolute 0.2 0.0 - 0.7 K/uL   Basophils Absolute 0.0 0.0 - 0.1 K/uL  Comprehensive metabolic panel with GFR   Collection Time: 06/11/24  8:23 AM  Result Value Ref Range   Sodium 139 135 - 145 mEq/L   Potassium 4.6 3.5 - 5.1 mEq/L   Chloride 101 96 - 112 mEq/L   CO2 31 19 - 32 mEq/L   Glucose, Bld 107 (H) 70 - 99 mg/dL   BUN 28 (H) 6 - 23 mg/dL   Creatinine, Ser 9.05 0.40 - 1.50 mg/dL    Total Bilirubin 0.5 0.2 - 1.2 mg/dL   Alkaline Phosphatase 68 39 - 117 U/L   AST 17 0 - 37 U/L   ALT 20 0 - 53 U/L   Total Protein 7.6 6.0 - 8.3 g/dL   Albumin 4.5 3.5 - 5.2 g/dL   GFR 17.83 >39.99 mL/min   Calcium  9.0 8.4 - 10.5 mg/dL  Lipid panel   Collection Time: 06/11/24  8:23 AM  Result Value Ref Range   Cholesterol 202 (H) 0 - 200 mg/dL   Triglycerides 847.9 (H) 0.0 - 149.0 mg/dL   HDL 60.49 >60.99 mg/dL   VLDL 69.5 0.0 - 59.9 mg/dL   LDL Cholesterol 867 (H) 0 - 99 mg/dL   Total CHOL/HDL Ratio 5    NonHDL 162.79    Echocardiogram 12/2023: LVEF 60-65%, 56% by 3D volume, normal function without abnormal wall motion, mild concentric LVH, G1DD, normal global longitudinal strain, normal RV systolic function, tr MR, mild calcified sclerotic AV without AS, borderline ascending aorta dilation 38mm      06/17/2024   10:13 AM 10/22/2023   10:11 AM 04/16/2023    9:06 AM 06/08/2022   12:18 PM 04/03/2022    8:54 AM  Depression screen PHQ 2/9  Decreased Interest 0 0 0 1 0  Down, Depressed, Hopeless 1 0 0 1 0  PHQ - 2 Score 1 0 0 2 0  Altered sleeping 1   2   Tired, decreased energy 1   2   Change in appetite 1   0   Feeling bad or failure about yourself  1   1   Trouble concentrating 0   0   Moving slowly or fidgety/restless 0   1   Suicidal thoughts 0   0   PHQ-9 Score 5   8   Difficult doing work/chores Not difficult at all   Somewhat difficult        06/17/2024   10:13 AM 10/22/2023   10:11 AM 06/08/2022   12:19 PM 02/22/2020   12:19 PM  GAD 7 : Generalized Anxiety Score  Nervous, Anxious, on Edge 1 1 1 1   Control/stop worrying 1 1 0 0  Worry too much - different things 1 1 1 1   Trouble relaxing 1 1 1 1   Restless 1 1 3 1   Easily annoyed or irritable 1 0 2 1  Afraid - awful might happen 0 0 0 0  Total GAD 7 Score 6 5 8 5   Anxiety Difficulty  Not difficult at all Somewhat difficult    Assessment & Plan:   Problem List Items Addressed This Visit     Health maintenance  examination (Chronic)   Preventative protocols reviewed and updated unless pt declined. Discussed healthy diet and lifestyle.  Advanced directives, counseling/discussion (Chronic)   Advanced directive discussion - does not have. Packet previously provided. Full code, would not want prolonged life support if terminal. Would want wife to be HCPOA. He will continue working on this.       Medicare annual wellness visit, subsequent - Primary (Chronic)   I have personally reviewed the Medicare Annual Wellness questionnaire and have noted 1. The patient's medical and social history 2. Their use of alcohol, tobacco or illicit drugs 3. Their current medications and supplements 4. The patient's functional ability including ADL's, fall risks, home safety risks and hearing or visual impairment. Cognitive function has been assessed and addressed as indicated.  5. Diet and physical activity 6. Evidence for depression or mood disorders The patients weight, height, BMI have been recorded in the chart. I have made referrals, counseling and provided education to the patient based on review of the above and I have provided the pt with a written personalized care plan for preventive services. Provider list updated.. See scanned questionairre as needed for further documentation. Reviewed preventative protocols and updated unless pt declined.       GERD (gastroesophageal reflux disease)   Continues omeprazole  daily.       Relevant Medications   omeprazole  (PRILOSEC) 40 MG capsule   Obesity, Class II, BMI 35-39.9, no comorbidity   Continue to encourage healthy diet and lifestyle choices to affect sustainable weight loss  Obesity complicated by diabetes, hypertension, hyperlipidemia, OA Consider GLP1RA      Dyslipidemia associated with type 2 diabetes mellitus (HCC)   Chronic off med.  Reviewed elevated ASCVD risk. Start atorvastatin  20mg  daily, monitoring for side effects.  The 10-year ASCVD  risk score (Arnett DK, et al., 2019) is: 46.5%   Values used to calculate the score:     Age: 45 years     Clincally relevant sex: Male     Is Non-Hispanic African American: No     Diabetic: Yes     Tobacco smoker: Yes     Systolic Blood Pressure: 140 mmHg     Is BP treated: No     HDL Cholesterol: 39.5 mg/dL     Total Cholesterol: 202 mg/dL       Relevant Medications   atorvastatin  (LIPITOR) 20 MG tablet   Type 2 diabetes mellitus with other specified complication (HCC)   New, diet controlled Recommend low sugar low carb diabetic diet - handout on diabetes and nutrition provided.  Rec yearly eye exam.  RTC 6 mo DM f/u visit.       Relevant Medications   atorvastatin  (LIPITOR) 20 MG tablet   AAA (abdominal aortic aneurysm) (HCC)   No AAA noted on abd aortic US  10/2023      Relevant Medications   atorvastatin  (LIPITOR) 20 MG tablet   OSA on CPAP   Continue nightly auto-CPAP followed by pulm Dr Neysa - appreciate their care.       Lesion of urinary bladder   Sp benign biopsy on TURBT 11/2022  Continue seeing urology yearly      Adjustment disorder with mixed anxiety and depressed mood   Sertraline  started last year - will continue 50mg  daily dose      Relevant Medications   sertraline  (ZOLOFT ) 50 MG tablet   Ascending aorta dilatation (HCC)   Borderline on echo 12/2023 - will continue to monitor, working towards optimizing BP control.       Relevant Medications   atorvastatin  (LIPITOR) 20 MG tablet   Contact  dermatitis due to poison ivy   To left ankle/achilles - ERx high potency steroid cream.       Relevant Medications   clobetasol  cream (TEMOVATE ) 0.05 %     Meds ordered this encounter  Medications   cyclobenzaprine  (FLEXERIL ) 10 MG tablet    Sig: Take 1 tablet (10 mg total) by mouth 3 (three) times daily as needed for muscle spasms (sedation precautions).    Dispense:  30 tablet    Refill:  0   omeprazole  (PRILOSEC) 40 MG capsule    Sig: Take 1  capsule (40 mg total) by mouth daily.    Dispense:  90 capsule    Refill:  3   sertraline  (ZOLOFT ) 50 MG tablet    Sig: Take 1 tablet (50 mg total) by mouth daily.    Dispense:  90 tablet    Refill:  3   atorvastatin  (LIPITOR) 20 MG tablet    Sig: Take 1 tablet (20 mg total) by mouth daily.    Dispense:  90 tablet    Refill:  3   clobetasol  cream (TEMOVATE ) 0.05 %    Sig: Apply 1 Application topically 2 (two) times daily. Apply to AA for poison ivy to left ankle    Dispense:  30 g    Refill:  0    No orders of the defined types were placed in this encounter.   Patient Instructions  If interested, check with pharmacy about new 2 shot shingles series (shingrix) or let us  know if you'd prefer to get here.  Sugars are in diet controlled diabetes range - work on low sugar low carb diabetic diet. Request diabetic eye exam next eye doctor appointment.  Start atorvastatin  20mg  daily for cholesterol  Return in 6 months for diabetes follow up visit   Follow up plan: Return in about 6 months (around 12/18/2024) for follow up visit.  Anton Blas, MD

## 2024-06-17 NOTE — Patient Instructions (Addendum)
 If interested, check with pharmacy about new 2 shot shingles series (shingrix) or let us  know if you'd prefer to get here.  Sugars are in diet controlled diabetes range - work on low sugar low carb diabetic diet. Request diabetic eye exam next eye doctor appointment.  Start atorvastatin  20mg  daily for cholesterol  Return in 6 months for diabetes follow up visit

## 2024-06-17 NOTE — Assessment & Plan Note (Signed)

## 2024-06-18 ENCOUNTER — Other Ambulatory Visit: Payer: Self-pay | Admitting: Podiatry

## 2024-06-18 DIAGNOSIS — L237 Allergic contact dermatitis due to plants, except food: Secondary | ICD-10-CM | POA: Insufficient documentation

## 2024-06-18 NOTE — Assessment & Plan Note (Signed)
 No AAA noted on abd aortic US  10/2023

## 2024-06-18 NOTE — Assessment & Plan Note (Signed)
 Continues omeprazole  daily.

## 2024-06-18 NOTE — Assessment & Plan Note (Signed)
 Sp benign biopsy on TURBT 11/2022  Continue seeing urology yearly

## 2024-06-18 NOTE — Assessment & Plan Note (Signed)
 To left ankle/achilles - ERx high potency steroid cream.

## 2024-06-18 NOTE — Assessment & Plan Note (Addendum)
 Continue to encourage healthy diet and lifestyle choices to affect sustainable weight loss  Obesity complicated by diabetes, hypertension, hyperlipidemia, OA Consider GLP1RA

## 2024-06-18 NOTE — Assessment & Plan Note (Signed)
 Continue nightly auto-CPAP followed by pulm Dr Neysa - appreciate their care.

## 2024-06-18 NOTE — Assessment & Plan Note (Signed)
 Sertraline  started last year - will continue 50mg  daily dose

## 2024-06-18 NOTE — Assessment & Plan Note (Signed)
 Borderline on echo 12/2023 - will continue to monitor, working towards optimizing BP control.

## 2024-07-05 DIAGNOSIS — G4733 Obstructive sleep apnea (adult) (pediatric): Secondary | ICD-10-CM | POA: Diagnosis not present

## 2024-07-11 ENCOUNTER — Other Ambulatory Visit: Payer: Self-pay | Admitting: Podiatry

## 2024-08-03 DIAGNOSIS — L814 Other melanin hyperpigmentation: Secondary | ICD-10-CM | POA: Diagnosis not present

## 2024-08-03 DIAGNOSIS — L570 Actinic keratosis: Secondary | ICD-10-CM | POA: Diagnosis not present

## 2024-08-03 DIAGNOSIS — D485 Neoplasm of uncertain behavior of skin: Secondary | ICD-10-CM | POA: Diagnosis not present

## 2024-08-03 DIAGNOSIS — L821 Other seborrheic keratosis: Secondary | ICD-10-CM | POA: Diagnosis not present

## 2024-08-03 DIAGNOSIS — D225 Melanocytic nevi of trunk: Secondary | ICD-10-CM | POA: Diagnosis not present

## 2024-08-07 ENCOUNTER — Other Ambulatory Visit: Payer: Self-pay | Admitting: Podiatry

## 2024-09-02 ENCOUNTER — Other Ambulatory Visit: Payer: Self-pay | Admitting: Podiatry

## 2024-09-27 ENCOUNTER — Other Ambulatory Visit: Payer: Self-pay | Admitting: Podiatry

## 2024-10-12 DIAGNOSIS — K080 Exfoliation of teeth due to systemic causes: Secondary | ICD-10-CM | POA: Diagnosis not present

## 2024-10-15 DIAGNOSIS — M5416 Radiculopathy, lumbar region: Secondary | ICD-10-CM | POA: Diagnosis not present

## 2024-10-15 DIAGNOSIS — M25551 Pain in right hip: Secondary | ICD-10-CM | POA: Diagnosis not present

## 2024-12-18 ENCOUNTER — Encounter: Payer: Self-pay | Admitting: Family Medicine

## 2024-12-18 ENCOUNTER — Ambulatory Visit: Admitting: Family Medicine

## 2024-12-18 VITALS — BP 140/84 | HR 60 | Temp 97.9°F | Ht 74.5 in | Wt 293.6 lb

## 2024-12-18 DIAGNOSIS — E66812 Obesity, class 2: Secondary | ICD-10-CM

## 2024-12-18 DIAGNOSIS — E1169 Type 2 diabetes mellitus with other specified complication: Secondary | ICD-10-CM

## 2024-12-18 LAB — POCT GLYCOSYLATED HEMOGLOBIN (HGB A1C): Hemoglobin A1C: 6.1 % — AB (ref 4.0–5.6)

## 2024-12-18 NOTE — Assessment & Plan Note (Addendum)
 Chronic, great control with improvement in A1c to 6.1% - remains diet controlled. He will touch base with family member who is dietician.  Rec schedule diabetic eye exam.  RTC 6 mo CPE Diabetes associated with dyslipidemia and obesity

## 2024-12-18 NOTE — Progress Notes (Signed)
 " Ph: [phone] Fax: [phone]   Patient ID: Brian Villanueva Brian Villanueva., male    DOB: 1956-03-30, 69 y.o.   MRN: [id number]  This visit was conducted in person.  BP (!) 140/84 (BP Location: Left Arm, Patient Position: Sitting, Cuff Size: Normal)   Pulse 60   Temp 97.9 F (36.6 C) (Oral)   Ht 6' 2.5 (1.892 m)   Wt 293 lb 9.6 oz (133.2 kg)   SpO2 95%   BMI 37.19 kg/m   BP Readings from Last 3 Encounters:  12/18/24 (!) 140/84  06/17/24 (!) 140/82  10/22/23 128/84   CC: DM f/u visit  Subjective:   HPI: Brian Villanueva. is a 69 y.o. male presenting on 12/18/2024 for Medical Management of Chronic Issues (DM FU/)   Planning to retire 08/2025  Sees dermatologist - had atypical nevus removed from back.   HLD - atorvastatin  started last visit. Tolerating well.   HTN - BP elevated off medication. No HA, vision changes, CP/tightness, SOB, leg swelling. Limits salt/sodium in diet.   DM - does not regularly check sugars. Compliant with antihyperglycemic regimen which includes: diet controlled. Denies low sugars or hypoglycemic symptoms. Denies paresthesias, blurry vision. Last diabetic eye exam DUE. Uses glasses for computer use. Glucometer brand: doesn't have one. Last foot exam: Due. DSME: has not completed. Notes DIL is dietitian.  Lab Results  Component Value Date   HGBA1C 6.1 (A) 12/18/2024   Diabetic Foot Exam - Simple   Simple Foot Form Diabetic Foot exam was performed with the following findings: Yes 12/18/2024  8:53 AM  Visual Inspection No deformities, no ulcerations, no other skin breakdown bilaterally: Yes Sensation Testing Intact to touch and monofilament testing bilaterally: Yes Pulse Check Posterior Tibialis and Dorsalis pulse intact bilaterally: Yes Comments No claudication    No results found for: MACKEY CURRENT       Relevant past medical, surgical, family and social history reviewed and updated as indicated. Interim medical history since our last  visit reviewed. Allergies and medications reviewed and updated. Outpatient Medications Prior to Visit  Medication Sig Dispense Refill   acetaminophen  (TYLENOL ) 500 MG tablet Take 500-1,000 mg by mouth every 6 (six) hours as needed (pain.).     aspirin-acetaminophen -caffeine (EXCEDRIN MIGRAINE) 250-250-65 MG tablet Take 1-2 tablets by mouth every 8 (eight) hours as needed for headache.     atorvastatin  (LIPITOR) 20 MG tablet Take 1 tablet (20 mg total) by mouth daily. 90 tablet 3   Cholecalciferol (VITAMIN D3) 125 MCG (5000 UT) TABS Take 5,000 Units by mouth daily.     clobetasol  cream (TEMOVATE ) 0.05 % Apply 1 Application topically 2 (two) times daily. Apply to AA for poison ivy to left ankle (Patient taking differently: Apply 1 Application topically as needed. Apply to AA for poison ivy to left ankle) 30 g 0   cyclobenzaprine  (FLEXERIL ) 10 MG tablet Take 1 tablet (10 mg total) by mouth 3 (three) times daily as needed for muscle spasms (sedation precautions). 30 tablet 0   diclofenac  (VOLTAREN ) 75 MG EC tablet Take 1 tablet by mouth twice daily with food 50 tablet 0   meloxicam (MOBIC) 15 MG tablet Take 15 mg by mouth daily.     Multiple Vitamin (MULTIVITAMIN WITH MINERALS) TABS tablet Take 1 tablet by mouth in the morning.     omeprazole  (PRILOSEC) 40 MG capsule Take 1 capsule (40 mg total) by mouth daily. 90 capsule 3   sertraline  (ZOLOFT ) 50 MG tablet Take 1  tablet (50 mg total) by mouth daily. 90 tablet 3   tamsulosin (FLOMAX) 0.4 MG CAPS capsule Take 0.4 mg by mouth at bedtime.     vitamin C (VITAMIN C) 500 MG tablet Take 2 tablets (1,000 mg total) by mouth daily.     Zinc 22.5 MG TABS Take 22.5 mg by mouth in the morning.     No facility-administered medications prior to visit.     Per HPI unless specifically indicated in ROS section below Review of Systems  Objective:  BP (!) 140/84 (BP Location: Left Arm, Patient Position: Sitting, Cuff Size: Normal)   Pulse 60   Temp 97.9 F  (36.6 C) (Oral)   Ht 6' 2.5 (1.892 m)   Wt 293 lb 9.6 oz (133.2 kg)   SpO2 95%   BMI 37.19 kg/m   Wt Readings from Last 3 Encounters:  12/18/24 293 lb 9.6 oz (133.2 kg)  06/17/24 288 lb 6 oz (130.8 kg)  04/09/24 293 lb (132.9 kg)      Physical Exam Vitals and nursing note reviewed.  Constitutional:      Appearance: Normal appearance. He is not ill-appearing.  HENT:     Head: Normocephalic and atraumatic.     Mouth/Throat:     Mouth: Mucous membranes are moist.     Pharynx: Oropharynx is clear. No oropharyngeal exudate or posterior oropharyngeal erythema.  Eyes:     Extraocular Movements: Extraocular movements intact.     Conjunctiva/sclera: Conjunctivae normal.     Pupils: Pupils are equal, round, and reactive to light.  Cardiovascular:     Rate and Rhythm: Normal rate and regular rhythm. Occasional Extrasystoles are present.    Pulses: Normal pulses.     Heart sounds: Normal heart sounds. No murmur heard. Pulmonary:     Effort: Pulmonary effort is normal. No respiratory distress.     Breath sounds: Normal breath sounds. No wheezing, rhonchi or rales.  Musculoskeletal:     Cervical back: Normal range of motion and neck supple.     Right lower leg: No edema.     Left lower leg: No edema.     Comments: See HPI for foot exam if done  Skin:    General: Skin is warm and dry.     Findings: No rash.  Neurological:     Mental Status: He is alert.  Psychiatric:        Mood and Affect: Mood normal.        Behavior: Behavior normal.       Results for orders placed or performed in visit on 12/18/24  HgB A1c   Collection Time: 12/18/24  8:42 AM  Result Value Ref Range   Hemoglobin A1C 6.1 (A) 4.0 - 5.6 %   HbA1c POC (<> result, manual entry)     HbA1c, POC (prediabetic range)     HbA1c, POC (controlled diabetic range)     Lab Results  Component Value Date   CHOL 202 (H) 06/11/2024   HDL 39.50 06/11/2024   LDLCALC 132 (H) 06/11/2024   LDLDIRECT 129.0 06/08/2022    TRIG 152.0 (H) 06/11/2024   CHOLHDL 5 06/11/2024   Assessment & Plan:   Problem List Items Addressed This Visit     Obesity, Class II, BMI 35-39.9, no comorbidity   Continue to encourage healthy diet and lifestyle choices to affect sustainable weight loss. Obesity complicated by DM, HTN, HLD, OSA.       Dyslipidemia associated with type 2 diabetes mellitus (HCC)  Type 2 diabetes mellitus with other specified complication (HCC) - Primary   Chronic, great control with improvement in A1c to 6.1% - remains diet controlled. He will touch base with family member who is dietician.  Rec schedule diabetic eye exam.  RTC 6 mo CPE Diabetes associated with dyslipidemia and obesity      Relevant Orders   HgB A1c (Completed)     No orders of the defined types were placed in this encounter.   Orders Placed This Encounter  Procedures   HgB A1c    Patient Instructions  Schedule eye exam, let them know your sugars are in diet controlled diabetes range.  Let me know if interested in referral for diabetes classes.  Work on low sugar low carb diabetic diet to keep sugars under control.  A1c was better - down to 6.1% today.  Return as needed or in 6 months for physical  Follow up plan: Return in about 6 months (around 06/17/2025) for annual exam, prior fasting for blood work.  Anton Blas, MD   "

## 2024-12-18 NOTE — Patient Instructions (Addendum)
 Schedule eye exam, let them know your sugars are in diet controlled diabetes range.  Let me know if interested in referral for diabetes classes.  Work on low sugar low carb diabetic diet to keep sugars under control.  A1c was better - down to 6.1% today.  Return as needed or in 6 months for physical

## 2024-12-18 NOTE — Assessment & Plan Note (Addendum)
 Continue to encourage healthy diet and lifestyle choices to affect sustainable weight loss. Obesity complicated by DM, HTN, HLD, OSA.

## 2025-06-14 ENCOUNTER — Other Ambulatory Visit

## 2025-06-21 ENCOUNTER — Encounter: Admitting: Family Medicine
